# Patient Record
Sex: Male | Born: 1970 | Race: White | Hispanic: No | Marital: Married | Smoking: Current every day smoker
Health system: Southern US, Community
[De-identification: ages and names within clinical notes are randomized; demographics above are authoritative.]

## PROBLEM LIST (undated history)

## (undated) DIAGNOSIS — T4145XA Adverse effect of unspecified anesthetic, initial encounter: Secondary | ICD-10-CM

## (undated) DIAGNOSIS — J449 Chronic obstructive pulmonary disease, unspecified: Secondary | ICD-10-CM

## (undated) DIAGNOSIS — J42 Unspecified chronic bronchitis: Secondary | ICD-10-CM

## (undated) DIAGNOSIS — S7400XA Injury of sciatic nerve at hip and thigh level, unspecified leg, initial encounter: Secondary | ICD-10-CM

## (undated) DIAGNOSIS — F329 Major depressive disorder, single episode, unspecified: Secondary | ICD-10-CM

## (undated) DIAGNOSIS — I10 Essential (primary) hypertension: Secondary | ICD-10-CM

## (undated) DIAGNOSIS — N179 Acute kidney failure, unspecified: Secondary | ICD-10-CM

## (undated) DIAGNOSIS — M545 Low back pain: Secondary | ICD-10-CM

## (undated) DIAGNOSIS — G8929 Other chronic pain: Secondary | ICD-10-CM

## (undated) DIAGNOSIS — F32A Depression, unspecified: Secondary | ICD-10-CM

## (undated) DIAGNOSIS — R51 Headache: Secondary | ICD-10-CM

## (undated) DIAGNOSIS — F1910 Other psychoactive substance abuse, uncomplicated: Secondary | ICD-10-CM

## (undated) DIAGNOSIS — M5137 Other intervertebral disc degeneration, lumbosacral region: Secondary | ICD-10-CM

---
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
## 2006-11-15 ENCOUNTER — Emergency Department (HOSPITAL_COMMUNITY): Admission: EM | Admit: 2006-11-15 | Discharge: 2006-11-15 | Payer: Self-pay | Admitting: Emergency Medicine

## 2006-11-16 ENCOUNTER — Emergency Department (HOSPITAL_COMMUNITY): Admission: EM | Admit: 2006-11-16 | Discharge: 2006-11-17 | Payer: Self-pay | Admitting: Emergency Medicine

## 2008-04-03 ENCOUNTER — Emergency Department (HOSPITAL_BASED_OUTPATIENT_CLINIC_OR_DEPARTMENT_OTHER): Admission: EM | Admit: 2008-04-03 | Discharge: 2008-04-04 | Payer: Self-pay | Admitting: Emergency Medicine

## 2008-04-23 ENCOUNTER — Emergency Department (HOSPITAL_BASED_OUTPATIENT_CLINIC_OR_DEPARTMENT_OTHER): Admission: EM | Admit: 2008-04-23 | Discharge: 2008-04-23 | Payer: Self-pay | Admitting: Emergency Medicine

## 2008-04-27 ENCOUNTER — Ambulatory Visit: Payer: Self-pay | Admitting: Cardiology

## 2008-05-02 ENCOUNTER — Emergency Department (HOSPITAL_BASED_OUTPATIENT_CLINIC_OR_DEPARTMENT_OTHER): Admission: EM | Admit: 2008-05-02 | Discharge: 2008-05-02 | Payer: Self-pay | Admitting: Emergency Medicine

## 2008-05-11 ENCOUNTER — Ambulatory Visit: Payer: Self-pay

## 2008-05-11 ENCOUNTER — Ambulatory Visit: Payer: Self-pay | Admitting: Cardiology

## 2008-08-04 ENCOUNTER — Emergency Department (HOSPITAL_BASED_OUTPATIENT_CLINIC_OR_DEPARTMENT_OTHER): Admission: EM | Admit: 2008-08-04 | Discharge: 2008-08-05 | Payer: Self-pay | Admitting: Emergency Medicine

## 2009-01-02 ENCOUNTER — Ambulatory Visit: Payer: Self-pay | Admitting: Radiology

## 2009-01-02 ENCOUNTER — Emergency Department (HOSPITAL_BASED_OUTPATIENT_CLINIC_OR_DEPARTMENT_OTHER): Admission: EM | Admit: 2009-01-02 | Discharge: 2009-01-02 | Payer: Self-pay | Admitting: Emergency Medicine

## 2009-05-08 ENCOUNTER — Emergency Department (HOSPITAL_BASED_OUTPATIENT_CLINIC_OR_DEPARTMENT_OTHER): Admission: EM | Admit: 2009-05-08 | Discharge: 2009-05-08 | Payer: Self-pay | Admitting: Emergency Medicine

## 2009-08-12 ENCOUNTER — Telehealth (INDEPENDENT_AMBULATORY_CARE_PROVIDER_SITE_OTHER): Payer: Self-pay | Admitting: *Deleted

## 2010-07-09 NOTE — Progress Notes (Signed)
Summary: REFILL   Phone Note Refill Request Call back at [phone] Message from:  Patient on August 12, 2009 3:05 PM  Refills Requested: Medication #1:  ATENOLOL 25 MG TABS 1 by mouth daily. [facility] [phone]  Initial call taken by: Judie Grieve,  August 12, 2009 3:06 PM Caller: Patient  Follow-up for Phone Call        Rx faxed to pharmacy Follow-up by: Oswald Hillock,  August 12, 2009 4:34 PM    Prescriptions: ATENOLOL 25 MG TABS (ATENOLOL) 1 by mouth daily  #30 x 5   Entered by:   Oswald Hillock   Authorized by:   Rollene Rotunda, MD, [facility]   Signed by:   Oswald Hillock on 08/12/2009   Method used:   Electronically to        [facility] (retail)       [address]       [place], [place]  [id number]       Ph: [phone]       Fax: [phone]   RxID:   [id number]

## 2010-07-09 NOTE — Miscellaneous (Signed)
Summary: atenolol rx  Clinical Lists Changes  Medications: Changed medication from ATENOLOL 25 MG TABS (ATENOLOL) 1 by mouth daily to ATENOLOL 25 MG TABS (ATENOLOL) one by mouth bid - Signed Rx of ATENOLOL 25 MG TABS (ATENOLOL) one by mouth bid;  #60 x 11;  Signed;  Entered by: Charolotte Capuchin, RN;  Authorized by: Rollene Rotunda, MD, [facility];  Method used: Electronically to [facility]. [phone]*, [address] [place], [place]  [address], Ph: [phone], Fax: [phone]    Prescriptions: ATENOLOL 25 MG TABS (ATENOLOL) one by mouth bid  #60 x 11   Entered by:   Charolotte Capuchin, RN   Authorized by:   Rollene Rotunda, MD, [facility]   Signed by:   Charolotte Capuchin, RN on 08/16/2009   Method used:   Electronically to        [facility]. [phone]* (retail)       [address]       [place], [place]  [address]       Ph: [phone]       Fax: [phone]   RxID:   [id number]

## 2010-09-23 LAB — DIFFERENTIAL
Basophils Absolute: 0.1 K/uL (ref 0.0–0.1)
Basophils Relative: 1 % (ref 0–1)
Eosinophils Absolute: 0.4 K/uL (ref 0.0–0.7)
Eosinophils Relative: 3 % (ref 0–5)
Lymphocytes Relative: 29 % (ref 12–46)
Lymphs Abs: 3.1 K/uL (ref 0.7–4.0)
Monocytes Absolute: 0.6 10*3/uL (ref 0.1–1.0)
Monocytes Relative: 6 % (ref 3–12)
Neutro Abs: 6.8 K/uL (ref 1.7–7.7)
Neutrophils Relative %: 62 % (ref 43–77)

## 2010-09-23 LAB — CBC
HCT: 41.8 % (ref 39.0–52.0)
Hemoglobin: 14.4 g/dL (ref 13.0–17.0)
MCHC: 34.5 g/dL (ref 30.0–36.0)
MCV: 96.9 fL (ref 78.0–100.0)
Platelets: 254 10*3/uL (ref 150–400)
RBC: 4.31 MIL/uL (ref 4.22–5.81)
RDW: 12.6 % (ref 11.5–15.5)
WBC: 11 10*3/uL — ABNORMAL HIGH (ref 4.0–10.5)

## 2010-09-23 LAB — POCT CARDIAC MARKERS
CKMB, poc: 2.3 ng/mL (ref 1.0–8.0)
Myoglobin, poc: 61 ng/mL (ref 12–200)
Troponin i, poc: <0.05 ng/mL (ref 0.00–0.09)

## 2010-09-23 LAB — BASIC METABOLIC PANEL WITH GFR
BUN: 18 mg/dL (ref 6–23)
CO2: 26 meq/L (ref 19–32)
Chloride: 108 meq/L (ref 96–112)
Creatinine, Ser: 1 mg/dL (ref 0.4–1.5)
GFR calc Af Amer: >60 mL/min (ref >60)
GFR calc non Af Amer: >60 mL/min (ref >60)

## 2010-09-23 LAB — BASIC METABOLIC PANEL
Calcium: 9 mg/dL (ref 8.4–10.5)
Glucose, Bld: 86 mg/dL (ref 70–99)
Potassium: 4.5 mEq/L (ref 3.5–5.1)
Sodium: 141 mEq/L (ref 135–145)

## 2010-10-21 NOTE — Assessment & Plan Note (Signed)
Jonathan Mcbride                            CARDIOLOGY OFFICE NOTE   NAME:Jonathan Mcbride, Jonathan Mcbride                        MRN:          [id number]  DATE:05/11/2008                            DOB:          1971-05-07    PRIMARY CARE PHYSICIAN:  Dr. Shelle Mcbride.   REASON FOR PRESENTATION:  Evaluate patient with palpitations and  hypertension.   HISTORY OF PRESENT ILLNESS:  The patient returns for followup of the  above.  Since I last saw him, he has had fewer palpitations.  He has  been taking atenolol 25 mg and he stopped drinking caffeine for the most  part.  He is still having occasional palpitation, but no presyncope or  syncope.  He is having no new chest discomfort, neck or arm discomfort.  He is still having a lot of problem with sleeping.  He is due to put on  his event monitor, but we did not have one when he was here last time so  he has not yet done that.  I have not got any echo from Jonathan Mcbride which  she was suppose to pickup.   PAST MEDICAL HISTORY:  Bronchitis.   ALLERGIES/INTOLERANCE:  CODEINE.   MEDICATIONS:  Atenolol 25 mg daily.   REVIEW OF SYSTEMS:  As stated in the HPI and otherwise negative for  other systems.   PHYSICAL EXAMINATION:  GENERAL:  The patient is pleasant in no distress.  VITAL SIGNS:  Blood pressure 111/80, heart rate 103 and regular, weight  272 pounds, and body mass index 35.  HEENT:  Eyes unremarkable.  Pupils are equal, round, and reactive to  light.  Fundi not visualized.  Oral mucosa is unremarkable.  NECK:  No jugular venous distension at 45 degrees, carotid upstroke  brisk and symmetrical.  No bruit.  No thyromegaly.  LYMPHATICS:  No cervical, axillary, or inguinal adenopathy.  LUNGS:  Clear to auscultation bilaterally.  BACK:  No costovertebral angle tenderness.  CHEST:  Unremarkable.  HEART:  PMI not displaced or sustained.  S1 and S2 are within normal  limits.  No S3.  No S4.  No clicks, no rubs, no murmurs.  ABDOMEN:  Obese, positive bowel sounds, normal in frequency and pitch.  No bruits, no rebound, no guarding.  No midline pulsatile mass.  No  organomegaly.  SKIN:  No rashes, no nodules.  EXTREMITIES:  A 2+ pulses throughout.  No edema, cyanosis, or clubbing.  NEURO:  Oriented to person, place, and time.  Cranial nerves II through  XII grossly intact.  Motor gross intact.   ASSESSMENT AND PLAN:  1. Palpitations.  These are improved, but I am still going to have him      over the event monitor.  He is going to avoid caffeine.  Further      evaluation will continually based on these results.  2. Tobacco.  He is going to stop smoking.  He is on Chantix and      tolerating this.  3. Hypertension.  Blood pressure is usually well controlled on the      medications as  listed.  We will also concentrate on weight loss.  4. Insomnia.  He is really bothered by this, but he cannot get hold of      this primary doctor.  I am going to go ahead and give him a 57-month      prescription for Ambien.  He will need to get refill for this, if      it works from his primary doctor.  5. Followup.  I will see the patient back in about 6 months, but we      will call him if anything abnormal on the stress perfusion or on      the event monitor.     Jonathan Rotunda, MD, [facility]  Electronically Signed    JH/MedQ  DD: 05/11/2008  DT: 05/12/2008  Job #: [id number]   cc:   Jonathan Mcbride

## 2010-10-21 NOTE — Assessment & Plan Note (Signed)
[facility]                            CARDIOLOGY OFFICE NOTE   NAME:Jonathan Mcbride, Leighty                        MRN:          [id number]  DATE:04/27/2008                            DOB:          May 19, 1971    PRIMARY CARE PHYSICIAN:  Shelle Iron, MD   REASON FOR PRESENTATION:  Evaluate the patient with headaches,  hypertension, and palpitations.   HISTORY OF PRESENT ILLNESS:  The patient is a pleasant 40 year old  gentleman with apparent history of chest discomfort.  He reports he had  a stress echo at [facility] about a year ago.  He says the  results were inconclusive, as they could not obtain the postexercise  images.  He has not had any further cardiac workup.  He has not had any  significant chest discomfort since that time.  He has now been bothered  by headaches.  These have been going on for perhaps 3 months.  They are  frontal.  When they come on, they last all day.  They are moderate in  intensity.  He has been taking quite a few ibuprofen to try to treat  these.  He says he may have noticed some correlation with his blood  pressure.  He went to the [facility] ER not long ago and had an  elevated blood pressure with the headache.  He was treated with pain  management, but no antihypertensive.  He has not had any blurred vision  loss or speech or motor.  He has not had any evaluation of this and did  not tell his new primary care doctor about this.   He has also been describing palpitations.  These happen perhaps every  couple of days now.  He describes his heart skipping.  He does not  describe sustained tachyarrhythmias, but rather isolated ectopic beats  possibly.  He does drink quite a bit of caffeine with coffee in the  morning and tea a couple times throughout the day.  He has never had a  monitor for this.   PAST MEDICAL HISTORY:  Bronchitis.   PAST SURGICAL HISTORY:  None.   ALLERGIES:  CODEINE.   MEDICATIONS:   Ibuprofen.   SOCIAL HISTORY:  The patient is married.  He has 29 year old son about  to enter the [employer].  He has been smoking for 16 years about three-  quarters of a pack a day currently.   FAMILY HISTORY:  Noncontributory for early coronary artery disease,  sudden cardiac death, syncope, heart failure.   REVIEW OF SYSTEMS:  As stated in the HPI and positive for occasional  wheezing, low back pain, tingling in his hands and fingers.  Negative  for all other systems.  Insomnia.   PHYSICAL EXAMINATION:  GENERAL:  The patient is pleasant and in no  distress.  VITAL SIGNS:  Blood pressure 134/100, heart rate 81 and regular, weight  270 pounds, body mass index 35.  HEENT:  Eyelids are unremarkable.  Pupils equal, round, and reactive to  light.  Fundi not well visualized.  Oral mucosa unremarkable.  NECK:  No  jugular venous distention at 45 degrees.  Carotid upstroke  brisk and symmetric, no bruit, no thyromegaly.  LYMPHATICS:  No cervical, axillary, or inguinal adenopathy.  LUNGS:  Clear to auscultation bilaterally.  BACK:  No costovertebral angle tenderness.  CHEST:  Unremarkable.  HEART:  PMI not displaced or sustained, S1 and S2 within normal limits.  No S3, no S4, no clicks, no rubs, no murmurs.  ABDOMEN:  Obese, positive bowel sounds, normal in frequency and pitch,  no bruits, no rebound, no guarding, no midline pulsatile mass, no  hepatomegaly, no splenomegaly.  SKIN:  No rashes, no nodules.  EXTREMITIES:  Pulses 2+ throughout, no edema, no cyanosis, no clubbing.  NEURO:  Oriented to person, place, and time.  Cranial nerves II through  XII grossly intact, motor grossly intact throughout.   EKG sinus rhythm, right bundle branch block, rate 79, no acute ST-T wave  changes.   ASSESSMENT AND PLAN:  1. Palpitations.  The patient is describing probable premature atrial      or ventricular ectopic beats.  At this point, I am going to have      him wear an event monitor to see  if we can capture these.  He has      had lab work.  I will try to see if he has had a TSH any time      recently.  If not, we can order this.  Further evaluation will be      based on future symptoms.  Of note, I have asked him to cut out the      caffeine which may be significantly contributing to the      palpitations.  2. Hypertension.  His blood pressure is elevated.  I am going to have      him start low dose of atenolol 25 mg at night.  This may help with      the palpitations.  He is also having insomnia.  This may improve      this.  He is also instructed to lose weight, which will contribute      to blood pressure control.  3. Right bundle branch block.  I am going to try to get his      echocardiogram results from [place] from the last year.  If they      have good enough images to exclude any structural heart disease,      then no further evaluation will be planned.  However, if not, I      will have to repeat an echocardiogram.  4. Followup.  I would like to see the patient back after the above      results.     Rollene Rotunda, MD, [facility]  Electronically Signed    JH/MedQ  DD: 04/27/2008  DT: 04/28/2008  Job #: [id number]

## 2011-03-10 LAB — DIFFERENTIAL
Basophils Absolute: 0.1
Lymphocytes Relative: 25
Monocytes Relative: 3

## 2011-03-10 LAB — CBC
RBC: 4.73
WBC: 9.8

## 2011-03-10 LAB — POCT TOXICOLOGY PANEL

## 2011-03-10 LAB — BASIC METABOLIC PANEL
BUN: 14
Calcium: 9.4
Creatinine, Ser: 1
Sodium: 141

## 2012-02-01 HISTORY — PX: HIP ARTHROSCOPY: SHX668

## 2012-03-08 DIAGNOSIS — T8859XA Other complications of anesthesia, initial encounter: Secondary | ICD-10-CM

## 2012-03-08 HISTORY — PX: PERCUTANEOUS PINNING PHALANX FRACTURE OF HAND: SUR1027

## 2012-03-08 HISTORY — PX: FIXATION KYPHOPLASTY LUMBAR SPINE: SHX1642

## 2012-03-08 HISTORY — DX: Other complications of anesthesia, initial encounter: T88.59XA

## 2013-11-22 ENCOUNTER — Emergency Department (HOSPITAL_BASED_OUTPATIENT_CLINIC_OR_DEPARTMENT_OTHER): Payer: Self-pay

## 2013-11-22 ENCOUNTER — Encounter (HOSPITAL_BASED_OUTPATIENT_CLINIC_OR_DEPARTMENT_OTHER): Payer: Self-pay | Admitting: Emergency Medicine

## 2013-11-22 ENCOUNTER — Observation Stay (HOSPITAL_BASED_OUTPATIENT_CLINIC_OR_DEPARTMENT_OTHER)
Admission: EM | Admit: 2013-11-22 | Discharge: 2013-11-23 | Disposition: A | Discharge status: Home / Self Care | Payer: Self-pay | Attending: Internal Medicine | Admitting: Internal Medicine

## 2013-11-22 DIAGNOSIS — F17200 Nicotine dependence, unspecified, uncomplicated: Secondary | ICD-10-CM | POA: Insufficient documentation

## 2013-11-22 DIAGNOSIS — R5381 Other malaise: Secondary | ICD-10-CM | POA: Insufficient documentation

## 2013-11-22 DIAGNOSIS — J4489 Other specified chronic obstructive pulmonary disease: Secondary | ICD-10-CM | POA: Insufficient documentation

## 2013-11-22 DIAGNOSIS — T675XXA Heat exhaustion, unspecified, initial encounter: Secondary | ICD-10-CM

## 2013-11-22 DIAGNOSIS — I10 Essential (primary) hypertension: Secondary | ICD-10-CM | POA: Insufficient documentation

## 2013-11-22 DIAGNOSIS — Z23 Encounter for immunization: Secondary | ICD-10-CM | POA: Insufficient documentation

## 2013-11-22 DIAGNOSIS — J449 Chronic obstructive pulmonary disease, unspecified: Secondary | ICD-10-CM | POA: Insufficient documentation

## 2013-11-22 DIAGNOSIS — R55 Syncope and collapse: Secondary | ICD-10-CM

## 2013-11-22 DIAGNOSIS — E860 Dehydration: Secondary | ICD-10-CM

## 2013-11-22 DIAGNOSIS — T675XXD Heat exhaustion, unspecified, subsequent encounter: Secondary | ICD-10-CM

## 2013-11-22 DIAGNOSIS — R079 Chest pain, unspecified: Principal | ICD-10-CM | POA: Diagnosis present

## 2013-11-22 DIAGNOSIS — R5383 Other fatigue: Secondary | ICD-10-CM

## 2013-11-22 DIAGNOSIS — N179 Acute kidney failure, unspecified: Secondary | ICD-10-CM | POA: Diagnosis present

## 2013-11-22 DIAGNOSIS — T671XXS Heat syncope, sequela: Secondary | ICD-10-CM

## 2013-11-22 DIAGNOSIS — R0602 Shortness of breath: Secondary | ICD-10-CM | POA: Insufficient documentation

## 2013-11-22 DIAGNOSIS — D72829 Elevated white blood cell count, unspecified: Secondary | ICD-10-CM | POA: Diagnosis present

## 2013-11-22 HISTORY — DX: Acute kidney failure, unspecified: N17.9

## 2013-11-22 HISTORY — DX: Other chronic pain: G89.29

## 2013-11-22 HISTORY — DX: Chronic obstructive pulmonary disease, unspecified: J44.9

## 2013-11-22 HISTORY — DX: Other intervertebral disc degeneration, lumbosacral region: M51.37

## 2013-11-22 HISTORY — DX: Adverse effect of unspecified anesthetic, initial encounter: T41.45XA

## 2013-11-22 HISTORY — DX: Unspecified chronic bronchitis: J42

## 2013-11-22 HISTORY — DX: Low back pain: M54.5

## 2013-11-22 HISTORY — DX: Injury of sciatic nerve at hip and thigh level, unspecified leg, initial encounter: S74.00XA

## 2013-11-22 HISTORY — DX: Essential (primary) hypertension: I10

## 2013-11-22 HISTORY — DX: Headache: R51

## 2013-11-22 LAB — RAPID URINE DRUG SCREEN, HOSP PERFORMED
AMPHETAMINES: NOT DETECTED
Barbiturates: NOT DETECTED
Benzodiazepines: NOT DETECTED
Cocaine: POSITIVE — AB
Opiates: NOT DETECTED
Tetrahydrocannabinol: POSITIVE — AB

## 2013-11-22 LAB — COMPREHENSIVE METABOLIC PANEL
ALK PHOS: 96 U/L (ref 39–117)
ALT: 36 U/L (ref 0–53)
AST: 44 U/L — ABNORMAL HIGH (ref 0–37)
Albumin: 5.4 g/dL — ABNORMAL HIGH (ref 3.5–5.2)
BILIRUBIN TOTAL: 0.7 mg/dL (ref 0.3–1.2)
BUN: 29 mg/dL — ABNORMAL HIGH (ref 6–23)
CALCIUM: 11.4 mg/dL — AB (ref 8.4–10.5)
CO2: 22 mEq/L (ref 19–32)
Chloride: 96 mEq/L (ref 96–112)
Creatinine, Ser: 2.8 mg/dL — ABNORMAL HIGH (ref 0.50–1.35)
GFR calc Af Amer: 30 mL/min — ABNORMAL LOW (ref >90)
GFR, EST NON AFRICAN AMERICAN: 26 mL/min — AB (ref >90)
GLUCOSE: 127 mg/dL — AB (ref 70–99)
Potassium: 4.5 mEq/L (ref 3.7–5.3)
Sodium: 139 mEq/L (ref 137–147)
Total Protein: 8.9 g/dL — ABNORMAL HIGH (ref 6.0–8.3)

## 2013-11-22 LAB — URINALYSIS, ROUTINE W REFLEX MICROSCOPIC
Glucose, UA: NEGATIVE mg/dL
HGB URINE DIPSTICK: NEGATIVE
KETONES UR: 15 mg/dL — AB
Leukocytes, UA: NEGATIVE
Nitrite: NEGATIVE
PROTEIN: 30 mg/dL — AB
Specific Gravity, Urine: 1.022 (ref 1.005–1.030)
Urobilinogen, UA: 0.2 mg/dL (ref 0.0–1.0)
pH: 5 (ref 5.0–8.0)

## 2013-11-22 LAB — CBC WITH DIFFERENTIAL/PLATELET
BASOS PCT: 0 % (ref 0–1)
Basophils Absolute: 0 10*3/uL (ref 0.0–0.1)
EOS ABS: 0.1 10*3/uL (ref 0.0–0.7)
Eosinophils Relative: 0 % (ref 0–5)
HCT: 47.6 % (ref 39.0–52.0)
Hemoglobin: 16.9 g/dL (ref 13.0–17.0)
LYMPHS ABS: 1.4 10*3/uL (ref 0.7–4.0)
Lymphocytes Relative: 9 % — ABNORMAL LOW (ref 12–46)
MCH: 35.3 pg — ABNORMAL HIGH (ref 26.0–34.0)
MCHC: 35.5 g/dL (ref 30.0–36.0)
MCV: 99.4 fL (ref 78.0–100.0)
MONO ABS: 1.1 10*3/uL — AB (ref 0.1–1.0)
Monocytes Relative: 7 % (ref 3–12)
Neutro Abs: 12.7 10*3/uL — ABNORMAL HIGH (ref 1.7–7.7)
Neutrophils Relative %: 83 % — ABNORMAL HIGH (ref 43–77)
PLATELETS: 270 10*3/uL (ref 150–400)
RBC: 4.79 MIL/uL (ref 4.22–5.81)
RDW: 14 % (ref 11.5–15.5)
WBC: 15.3 10*3/uL — ABNORMAL HIGH (ref 4.0–10.5)

## 2013-11-22 LAB — CBC
HCT: 41.7 % (ref 39.0–52.0)
Hemoglobin: 14.5 g/dL (ref 13.0–17.0)
MCH: 34.6 pg — ABNORMAL HIGH (ref 26.0–34.0)
MCHC: 34.8 g/dL (ref 30.0–36.0)
MCV: 99.5 fL (ref 78.0–100.0)
PLATELETS: 232 10*3/uL (ref 150–400)
RBC: 4.19 MIL/uL — ABNORMAL LOW (ref 4.22–5.81)
RDW: 14.5 % (ref 11.5–15.5)
WBC: 12.1 10*3/uL — ABNORMAL HIGH (ref 4.0–10.5)

## 2013-11-22 LAB — TROPONIN I

## 2013-11-22 LAB — URINE MICROSCOPIC-ADD ON

## 2013-11-22 LAB — CK: CK TOTAL: 604 U/L — AB (ref 7–232)

## 2013-11-22 MED ORDER — GABAPENTIN 400 MG PO CAPS
800.0000 mg | ORAL_CAPSULE | Freq: Three times a day (TID) | ORAL | Status: DC
  Administered 2013-11-23 (×3): 800 mg via ORAL
  Filled 2013-11-22 (×4): qty 2

## 2013-11-22 MED ORDER — ONDANSETRON HCL 4 MG/2ML IJ SOLN: 4.0000 mg | Freq: Four times a day (QID) | INTRAMUSCULAR | Status: DC | PRN

## 2013-11-22 MED ORDER — TRAMADOL HCL 50 MG PO TABS
50.0000 mg | ORAL_TABLET | Freq: Four times a day (QID) | ORAL | Status: DC | PRN
  Administered 2013-11-23 (×2): 50 mg via ORAL
  Filled 2013-11-22 (×2): qty 1

## 2013-11-22 MED ORDER — SODIUM CHLORIDE 0.9 % IV BOLUS (SEPSIS)
1000.0000 mL | Freq: Once | INTRAVENOUS | Status: AC
  Administered 2013-11-22: 1000 mL via INTRAVENOUS

## 2013-11-22 MED ORDER — ACETAMINOPHEN 325 MG PO TABS: 650.0000 mg | ORAL_TABLET | Freq: Four times a day (QID) | ORAL | Status: DC | PRN

## 2013-11-22 MED ORDER — SODIUM CHLORIDE 0.9 % IV SOLN
INTRAVENOUS | Status: DC
  Administered 2013-11-22 – 2013-11-23 (×3): via INTRAVENOUS

## 2013-11-22 MED ORDER — ALBUTEROL SULFATE (2.5 MG/3ML) 0.083% IN NEBU
INHALATION_SOLUTION | RESPIRATORY_TRACT | Status: AC
  Administered 2013-11-22: 2.5 mg
  Filled 2013-11-22: qty 3

## 2013-11-22 MED ORDER — SODIUM CHLORIDE 0.9 % IJ SOLN
3.0000 mL | Freq: Two times a day (BID) | INTRAMUSCULAR | Status: DC
  Administered 2013-11-23: 3 mL via INTRAVENOUS

## 2013-11-22 MED ORDER — PNEUMOCOCCAL VAC POLYVALENT 25 MCG/0.5ML IJ INJ
0.5000 mL | INJECTION | INTRAMUSCULAR | Status: AC
  Administered 2013-11-23: 0.5 mL via INTRAMUSCULAR
  Filled 2013-11-22: qty 0.5

## 2013-11-22 MED ORDER — NITROGLYCERIN 0.4 MG SL SUBL: 0.4000 mg | SUBLINGUAL_TABLET | SUBLINGUAL | Status: DC | PRN

## 2013-11-22 MED ORDER — CYCLOBENZAPRINE HCL 5 MG PO TABS
5.0000 mg | ORAL_TABLET | Freq: Two times a day (BID) | ORAL | Status: DC
  Administered 2013-11-23 (×2): 5 mg via ORAL
  Filled 2013-11-22 (×3): qty 1

## 2013-11-22 MED ORDER — ACETAMINOPHEN 650 MG RE SUPP: 650.0000 mg | Freq: Four times a day (QID) | RECTAL | Status: DC | PRN

## 2013-11-22 MED ORDER — ENOXAPARIN SODIUM 40 MG/0.4ML ~~LOC~~ SOLN
40.0000 mg | Freq: Every day | SUBCUTANEOUS | Status: DC
  Administered 2013-11-23: 40 mg via SUBCUTANEOUS
  Filled 2013-11-22 (×2): qty 0.4

## 2013-11-22 MED ORDER — ONDANSETRON HCL 4 MG PO TABS: 4.0000 mg | ORAL_TABLET | Freq: Four times a day (QID) | ORAL | Status: DC | PRN

## 2013-11-22 MED ORDER — GABAPENTIN 800 MG PO TABS
800.0000 mg | ORAL_TABLET | Freq: Three times a day (TID) | ORAL | Status: DC
  Filled 2013-11-22: qty 1

## 2013-11-22 MED ORDER — SODIUM CHLORIDE 0.9 % IV SOLN: INTRAVENOUS | Status: DC

## 2013-11-22 NOTE — ED Notes (Signed)
Pt given food and drink per Dr. Judd Lienelo.

## 2013-11-22 NOTE — ED Notes (Signed)
Pt c/o left chest pain onset while working outside. Pain feels like cramping and nonradiating. Also c/o 5-10 min loc and n/v.

## 2013-11-22 NOTE — H&P (Addendum)
[facility] History and Physical  Jonathan Mcbride ZOX:[id number] DOB: 04/16/1971 DOA: 11/22/2013  Referring physician: ER physician. [facility] PCP: No PCP Per Patient   Chief Complaint: Loss of consciousness and chest pain.  HPI: Jonathan Mcbride is a 43 y.o. male with history of sciatica and arrhythmias presented [facility] with complaints of loss of consciousness and chest pain. Patient states that he was working and his work required to carry [employer]weights and he started developing some chest pressure and later became diaphoretic and his lower extremity started cramping and lost consciousness. He has been having some headache since yesterday which is mostly in the occipital area. His colleagues sprinkled water on him and he regained his consciousness. Since then has been feeling fatigued and weak but by then his chest pressure had resolved. He went to his home and later came to the ER. In the ER labs revealed elevated creatinine with mildly elevated CK. Patient also had leukocytosis but was afebrile. Patient has been admitted for further management. Urine drug screen is positive for marijuana and cocaine. Patient has not been taking his Neurontin and Flexeril for last 3 days as he out of his medications. On exam patient is nonfocal and presently has no chest pain denies any shortness of breath still has occipital headache.  Review of Systems: As presented in the history of presenting illness, rest negative.  Past Medical History  Diagnosis Date  . COPD (chronic obstructive pulmonary disease)   . Hypertension   . DDD (degenerative disc disease)    Past Surgical History  Procedure Laterality Date  . Hand surgery    . Hip arthroscope     Social History:  reports that he has been smoking Cigarettes.  He has been smoking about 0.00 packs per day. He does not have any smokeless tobacco history on file. He reports that he drinks alcohol. He  reports that he uses illicit drugs (Marijuana). Where does patient live home. Can patient participate in ADLs? Yes.  No Known Allergies  Family History:  Family History  Problem Relation Age of Onset  . Diabetes Mellitus II Sister   . CAD Other   . Uterine cancer Maternal Aunt       Prior to Admission medications   Medication Sig Start Date End Date Taking? Authorizing Jonathan Mcbride  ATENOLOL PO Take by mouth.   Yes Historical Jared Whorley, MD  Cyclobenzaprine HCl (FLEXERIL PO) Take by mouth.   Yes Historical Abygayle Deltoro, MD  GABAPENTIN PO Take by mouth.   Yes Historical Starlette Thurow, MD    Physical Exam: Filed Vitals:   11/22/13 1816 11/22/13 2016 11/22/13 2200  BP: 151/79  117/75  Pulse: 93 76 92  Temp: 99 F (37.2 C)  98.9 F (37.2 C)  TempSrc: Oral  Oral  Resp: 16 18 18   Height: 6\' 2"  (1.88 m)    Weight: 111.131 kg (245 lb)    SpO2: 99% 100% 96%     General:  Well-developed and nourished.  Eyes: Anicteric no pallor.  ENT: No discharge from the ears eyes nose mouth.  Neck: No mass felt.  Cardiovascular: S1-S2 heard.  Respiratory: No rhonchi or crepitations.  Abdomen: Soft nontender bowel sounds present. No guarding rigidity.  Skin: No rash.  Musculoskeletal: No edema.  Psychiatric: Appears normal.  Neurologic: Alert awake oriented to time place and person. Moves all extremities.  Labs on Admission:  Basic Metabolic Panel:  Recent Labs Lab 11/22/13 1815  NA 139  K 4.5  CL 96  CO2 22  GLUCOSE 127*  BUN 29*  CREATININE 2.80*  CALCIUM 11.4*   Liver Function Tests:  Recent Labs Lab 11/22/13 1815  AST 44*  ALT 36  ALKPHOS 96  BILITOT 0.7  PROT 8.9*  ALBUMIN 5.4*   No results found for this basename: LIPASE, AMYLASE,  in the last 168 hours No results found for this basename: AMMONIA,  in the last 168 hours CBC:  Recent Labs Lab 11/22/13 1815  WBC 15.3*  NEUTROABS 12.7*  HGB 16.9  HCT 47.6  MCV 99.4  PLT 270   Cardiac Enzymes:  Recent  Labs Lab 11/22/13 1815 11/22/13 1843  CKTOTAL  --  604*  TROPONINI <0.30  --     BNP (last 3 results) No results found for this basename: PROBNP,  in the last 8760 hours CBG: No results found for this basename: GLUCAP,  in the last 168 hours  Radiological Exams on Admission: Dg Chest 2 View  11/22/2013   CLINICAL DATA:  Loss of consciousness  EXAM: CHEST  2 VIEW  COMPARISON:  None.  FINDINGS: Cardiomediastinal silhouette is stable. Mild lower thoracic levoscoliosis. Mild degenerative changes thoracic spine. No acute infiltrate or pulmonary edema. Subtle mild compression deformity mid thoracic spine of indeterminate age.  IMPRESSION: Mild lower thoracic levoscoliosis. Mild degenerative changes thoracic spine. No acute infiltrate or pulmonary edema. Subtle mild compression deformity mid thoracic spine of indeterminate age. Clinical correlation is necessary.   Electronically Signed   By: Natasha MeadLiviu  Pop M.D.   On: 11/22/2013 19:09    EKG: Independently reviewed. Normal sinus rhythm with RBBB and left axis deviation.  Assessment/Plan Principal Problem:   Syncope Active Problems:   Acute renal failure   Chest pain   Hypercalcemia   Leucocytosis   1. Syncope - probably seen to dehydration and heat exhaustion. Continue with aggressive IV hydration. Closely follow intake output and metabolic panel and CK levels. Patient also has positive for cocaine and we will check cardiac markers and rule out any arrhythmias as patient has had previous history of arrhythmias seen by [place] cardiologist Dr. Antoine PocheHochrein in 2009 and patient is beta blocker. 2. Chest pain - given that patient is positive for cocaine chest pain could be probably related to cocaine. Cycle cardiac markers. Aspirin. Check 2-D echo. 3. Acute renal failure and hypercalcemia - probably seen to dehydration. Aggressively hydrate and follow metabolic panel. Check parathormone vitamin D levels. Check FeNa. 4. History of arrhythmia - since  patient's urine is positive for cocaine I'm holding off beta blockers for now. 5. Leukocytosis - probably reactionary. Follow CBC. 6. History of low back pain and sciatica - I have placed patient back on his Neurontin and Flexeril. If patient's renal function does not improve with hydration then decrease Neurontin dose. 7. Headache - CT head is pending. When necessary tramadol. 8. Polysubstance abuse - tobacco cessation counseling requested and social work consult.    Code Status: Full code.  Family Communication: Wife at the bedside.  Disposition Plan: Admit for observation.    KAKRAKANDY,ARSHAD N. [facility] Pager [phone].  If 7PM-7AM, please contact night-coverage [email or web address] Password [email or web address] 11/22/2013, 10:43 PM
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
## 2013-11-22 NOTE — ED Notes (Signed)
Pt advised that he needed to provide a urine sample. Pt sts that he cannot at this time but will as soon as he can.

## 2013-11-23 ENCOUNTER — Observation Stay (HOSPITAL_COMMUNITY): Payer: Self-pay

## 2013-11-23 DIAGNOSIS — I517 Cardiomegaly: Secondary | ICD-10-CM

## 2013-11-23 DIAGNOSIS — T675XXA Heat exhaustion, unspecified, initial encounter: Secondary | ICD-10-CM

## 2013-11-23 DIAGNOSIS — Z5189 Encounter for other specified aftercare: Secondary | ICD-10-CM

## 2013-11-23 DIAGNOSIS — E860 Dehydration: Secondary | ICD-10-CM

## 2013-11-23 DIAGNOSIS — T7589XS Other specified effects of external causes, sequela: Secondary | ICD-10-CM

## 2013-11-23 LAB — CBC WITH DIFFERENTIAL/PLATELET
Basophils Absolute: 0 10*3/uL (ref 0.0–0.1)
Basophils Relative: 0 % (ref 0–1)
EOS ABS: 0.3 10*3/uL (ref 0.0–0.7)
EOS PCT: 3 % (ref 0–5)
HCT: 38.8 % — ABNORMAL LOW (ref 39.0–52.0)
Hemoglobin: 13 g/dL (ref 13.0–17.0)
LYMPHS ABS: 2.7 10*3/uL (ref 0.7–4.0)
Lymphocytes Relative: 28 % (ref 12–46)
MCH: 33.8 pg (ref 26.0–34.0)
MCHC: 33.5 g/dL (ref 30.0–36.0)
MCV: 100.8 fL — AB (ref 78.0–100.0)
MONO ABS: 1 10*3/uL (ref 0.1–1.0)
Monocytes Relative: 10 % (ref 3–12)
Neutro Abs: 5.7 10*3/uL (ref 1.7–7.7)
Neutrophils Relative %: 59 % (ref 43–77)
Platelets: 209 10*3/uL (ref 150–400)
RBC: 3.85 MIL/uL — AB (ref 4.22–5.81)
RDW: 14.7 % (ref 11.5–15.5)
WBC: 9.6 10*3/uL (ref 4.0–10.5)

## 2013-11-23 LAB — COMPREHENSIVE METABOLIC PANEL
ALBUMIN: 3.6 g/dL (ref 3.5–5.2)
ALT: 25 U/L (ref 0–53)
AST: 34 U/L (ref 0–37)
Alkaline Phosphatase: 81 U/L (ref 39–117)
BUN: 27 mg/dL — ABNORMAL HIGH (ref 6–23)
CO2: 24 mEq/L (ref 19–32)
CREATININE: 1.5 mg/dL — AB (ref 0.50–1.35)
Calcium: 9.1 mg/dL (ref 8.4–10.5)
Chloride: 106 mEq/L (ref 96–112)
GFR calc Af Amer: 64 mL/min — ABNORMAL LOW (ref >90)
GFR calc non Af Amer: 55 mL/min — ABNORMAL LOW (ref >90)
Glucose, Bld: 100 mg/dL — ABNORMAL HIGH (ref 70–99)
Potassium: 4 mEq/L (ref 3.7–5.3)
Sodium: 144 mEq/L (ref 137–147)
Total Bilirubin: 0.2 mg/dL — ABNORMAL LOW (ref 0.3–1.2)
Total Protein: 6.6 g/dL (ref 6.0–8.3)

## 2013-11-23 LAB — CK: Total CK: 678 U/L — ABNORMAL HIGH (ref 7–232)

## 2013-11-23 LAB — TROPONIN I: Troponin I: <0.3 ng/mL (ref <0.30)

## 2013-11-23 LAB — CREATININE, SERUM
Creatinine, Ser: 1.62 mg/dL — ABNORMAL HIGH (ref 0.50–1.35)
GFR calc Af Amer: 58 mL/min — ABNORMAL LOW (ref >90)
GFR calc non Af Amer: 50 mL/min — ABNORMAL LOW (ref >90)

## 2013-11-23 LAB — SODIUM, URINE, RANDOM: SODIUM UR: 75 meq/L

## 2013-11-23 LAB — CREATININE, URINE, RANDOM: Creatinine, Urine: 246.62 mg/dL

## 2013-11-23 MED ORDER — CYCLOBENZAPRINE HCL 5 MG PO TABS: 5.0000 mg | ORAL_TABLET | Freq: Two times a day (BID) | ORAL | Status: DC

## 2013-11-23 MED ORDER — ALBUTEROL SULFATE (2.5 MG/3ML) 0.083% IN NEBU: 2.5000 mg | INHALATION_SOLUTION | RESPIRATORY_TRACT | Status: DC | PRN

## 2013-11-23 MED ORDER — GABAPENTIN 800 MG PO TABS: 800.0000 mg | ORAL_TABLET | Freq: Three times a day (TID) | ORAL | Status: DC

## 2013-11-23 MED ORDER — IBUPROFEN 800 MG PO TABS
800.0000 mg | ORAL_TABLET | Freq: Once | ORAL | Status: AC
  Administered 2013-11-23: 800 mg via ORAL
  Filled 2013-11-23: qty 1

## 2013-11-23 MED ORDER — ALBUTEROL SULFATE HFA 108 (90 BASE) MCG/ACT IN AERS: 1.0000 | INHALATION_SPRAY | Freq: Four times a day (QID) | RESPIRATORY_TRACT | Status: DC | PRN

## 2013-11-23 MED ORDER — TRAMADOL HCL 50 MG PO TABS: 50.0000 mg | ORAL_TABLET | Freq: Four times a day (QID) | ORAL | Status: DC | PRN

## 2013-11-23 MED ORDER — ATENOLOL 25 MG PO TABS: 12.5000 mg | ORAL_TABLET | Freq: Two times a day (BID) | ORAL | Status: DC

## 2013-11-23 MED ORDER — NITROGLYCERIN 0.4 MG SL SUBL: 0.4000 mg | SUBLINGUAL_TABLET | SUBLINGUAL | Status: DC | PRN

## 2013-11-23 MED ORDER — ASPIRIN 325 MG PO TABS
325.0000 mg | ORAL_TABLET | Freq: Every day | ORAL | Status: DC
  Administered 2013-11-23: 325 mg via ORAL
  Filled 2013-11-23: qty 1

## 2013-11-23 MED ORDER — ATENOLOL 12.5 MG HALF TABLET
12.5000 mg | ORAL_TABLET | Freq: Every day | ORAL | Status: DC
  Administered 2013-11-23: 12.5 mg via ORAL
  Filled 2013-11-23: qty 1

## 2013-11-23 MED ORDER — HYDROCODONE-ACETAMINOPHEN 5-325 MG PO TABS: 1.0000 | ORAL_TABLET | Freq: Four times a day (QID) | ORAL | Status: DC | PRN

## 2013-11-23 NOTE — Progress Notes (Signed)
  Echocardiogram 2D Echocardiogram has been performed.  GREGORY, ANGELA 11/23/2013, 11:06 AM

## 2013-11-23 NOTE — Discharge Summary (Addendum)
Physician Discharge Summary  Jonathan Mcbride ZOX:[id number] DOB: 07/21/1970 DOA: 11/22/2013  PCP: No PCP Per Patient  Admit date: 11/22/2013 Discharge date: 11/23/2013  Recommendations for Outpatient Follow-up:  1. F/u with primary care doctor within 2 weeks.  Repeat BMP at next appointment and resume NSAIDS and stop narcotics if creatinine back to baseline.  Discharge Diagnoses:  Principal Problem:   Syncope likely due to dehydration and heat exhaustion Active Problems:   Acute renal failure   Chest pain   Hypercalcemia   Leucocytosis   Heat exhaustion   Dehydration   Discharge Condition: stable, improved  Diet recommendation: healthy heart  Wt Readings from Last 3 Encounters:  11/22/13 111.131 kg (245 lb)    History of present illness:  Jonathan Mcbride is a 43 y.o. male with history of sciatica and arrhythmias presented to the [facility] with complaints of loss of consciousness and chest pain. Patient states that he was working and his work required to carry [employer] and he started developing some chest pressure and later became diaphoretic and his lower extremity started cramping and lost consciousness. He has been having some headache since yesterday which is mostly in the occipital area. His colleagues sprinkled water on him and he regained his consciousness. Since then has been feeling fatigued and weak but by then his chest pressure had resolved. He went to his home and later came to the ER. In the ER labs revealed elevated creatinine with mildly elevated CK. Patient also had leukocytosis but was afebrile. Patient has been admitted for further management. Urine drug screen is positive for marijuana and cocaine. Patient has not been taking his Neurontin and Flexeril for last 3 days as he out of his medications. On exam patient is nonfocal and presently has no chest pain denies any shortness of breath still has occipital headache.  Hospital Course:   Syncope, probably due  to dehydration and heat exhaustion.  CT head was unremarkable. He was given IV fluids. His CPK was mildly elevated in the 600 range. Monitor demonstrated normal sinus rhythm with occasional PVCs. Troponins were negative. He felt much better after IV fluids.    Chest pain, given that patient is positive for cocaine chest pain could be probably related to cocaine, however, he states he last used at party about 7 days ago.  Most likely this is also secondary to heat exhaustion. Again his telemetry did not demonstrate any significant arrhythmias and his troponins were negative. His 2-D echocardiogram demonstrated a preserved ejection fraction of 60% with normal wall motion. Left atrium is mildly to moderately dilated and he had some mild LVH. His valves were structurally normal.  He was advised to restart his atenolol, however, he was advised not to take this medication if he uses cocaine and his dose was reduced to 12.5mg  BID because of low normal blood pressures.  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   Acute renal failure and hypercalcemia - probably due to dehydration and heavy NSAID use.  Hypercalcemia resolved with IVF and creatinine trended down rapidly.  FENa << 1% c/w dehydration  Leukocytosis - dehydrated and resolved with fluids   Low back pain and sciatica - restarted Neurontin and Flexeril.  Start hydrocodone prn pain while not able to take NSAIDS.    Headache - CT head is neg, continue when necessary tramadol.   Polysubstance abuse - tobacco cessation counseling  requested and social work consult    Consultants:  None Procedures:  CT head  CXR ECHO Antibiotics:  none    Discharge Exam: Filed Vitals:   11/23/13 1418  BP: 112/70  Pulse: 78  Temp: 98.3 F (36.8 C)  Resp: 17   Filed Vitals:   11/22/13 2016 11/22/13 2200 11/23/13 0604 11/23/13 1418  BP:  117/75 123/68 112/70   Pulse: 76 92 70 78  Temp:  98.9 F (37.2 C) 97.7 F (36.5 C) 98.3 F (36.8 C)  TempSrc:  Oral Oral Oral  Resp: 18 18 18 17   Height:      Weight:      SpO2: 100% 96% 97% 97%    General: CM, No acute distress  HEENT: NCAT, MMM  Cardiovascular: RRR, nl S1, S2 no mrg, 2+ pulses, warm extremities  Respiratory: CTAB, no increased WOB  Abdomen: NABS, soft, NT/ND  MSK: Normal tone and bulk, no LEE  Neuro: Grossly intact   Discharge Instructions      Discharge Instructions   Call MD for:  difficulty breathing, headache or visual disturbances    Complete by:  As directed      Call MD for:  extreme fatigue    Complete by:  As directed      Call MD for:  hives    Complete by:  As directed      Call MD for:  persistant dizziness or light-headedness    Complete by:  As directed      Call MD for:  persistant nausea and vomiting    Complete by:  As directed      Call MD for:  severe uncontrolled pain    Complete by:  As directed      Call MD for:  temperature >100.4    Complete by:  As directed      Diet general    Complete by:  As directed      Discharge instructions    Complete by:  As directed   You were hospitalized with fainting spell from heat exhaustion and dehydration.  Please try to minimize your time in the sun, take frequent breaks, and drink plenty of fluids. Do not use cocaine, naprosyn/aleve, ibuprofen/motrin, or aspirin which can worsen kidney function.  For the next two days, please do not work or go outside and focus on drinking plenty of fluids and continuing to rehydrate yourself.  You will need repeat bloodwork at your follow up appointment with your primary care doctor to check your kidney function.  Please cut your atenolol in half until you follow up with your primary care doctor.     Increase activity slowly    Complete by:  As directed             Medication List    STOP taking these medications       ibuprofen 200 MG tablet  Commonly known as:   ADVIL,MOTRIN      TAKE these medications       albuterol 108 (90 BASE) MCG/ACT inhaler  Commonly known as:  PROVENTIL HFA;VENTOLIN HFA  Inhale 1-2 puffs into the lungs every 6 (six) hours as needed for wheezing or shortness of breath.     atenolol 25 MG tablet  Commonly known as:  TENORMIN  Take 0.5 tablets (12.5 mg total) by mouth 2 (two) times daily.     cyclobenzaprine 10 MG tablet  Commonly known as:  FLEXERIL  Take 10 mg by mouth 2 (two)  times daily.     gabapentin 800 MG tablet  Commonly known as:  NEURONTIN  Take 800 mg by mouth 3 (three) times daily.     HYDROcodone-acetaminophen 5-325 MG per tablet  Commonly known as:  NORCO/VICODIN  Take 1 tablet by mouth every 6 (six) hours as needed for moderate pain.       Follow-up Information   Follow up with [facility]     In 3 weeks.   Contact information:   [address] [place] [address] [phone]       The results of significant diagnostics from this hospitalization (including imaging, microbiology, ancillary and laboratory) are listed below for reference.    Significant Diagnostic Studies: Dg Chest 2 View  11/22/2013   CLINICAL DATA:  Loss of consciousness  EXAM: CHEST  2 VIEW  COMPARISON:  None.  FINDINGS: Cardiomediastinal silhouette is stable. Mild lower thoracic levoscoliosis. Mild degenerative changes thoracic spine. No acute infiltrate or pulmonary edema. Subtle mild compression deformity mid thoracic spine of indeterminate age.  IMPRESSION: Mild lower thoracic levoscoliosis. Mild degenerative changes thoracic spine. No acute infiltrate or pulmonary edema. Subtle mild compression deformity mid thoracic spine of indeterminate age. Clinical correlation is necessary.   Electronically Signed   By: Natasha MeadLiviu  Pop M.D.   On: 11/22/2013 19:09   Ct Head Wo Contrast  11/23/2013   CLINICAL DATA:  Headache.  EXAM: CT HEAD WITHOUT CONTRAST  TECHNIQUE: Contiguous axial images were  obtained from the base of the skull through the vertex without intravenous contrast.  COMPARISON:  None.  FINDINGS: There is no evidence of acute infarction, mass lesion, or intra- or extra-axial hemorrhage on CT.  The posterior fossa, including the cerebellum, brainstem and fourth ventricle, is within normal limits. The third and lateral ventricles, and basal ganglia are unremarkable in appearance. The cerebral hemispheres are symmetric in appearance, with normal gray-white differentiation. No mass effect or midline shift is seen.  There is no evidence of fracture; visualized osseous structures are unremarkable in appearance. The orbits are within normal limits. The paranasal sinuses and mastoid air cells are well-aerated. No significant soft tissue abnormalities are seen.  IMPRESSION: Unremarkable noncontrast CT of the head.   Electronically Signed   By: Roanna RaiderJeffery  Chang M.D.   On: 11/23/2013 02:30    Microbiology: No results found for this or any previous visit (from the past 240 hour(s)).   Labs: Basic Metabolic Panel:  Recent Labs Lab 11/22/13 1815 11/22/13 2339 11/23/13 0408  NA 139  --  144  K 4.5  --  4.0  CL 96  --  106  CO2 22  --  24  GLUCOSE 127*  --  100*  BUN 29*  --  27*  CREATININE 2.80* 1.62* 1.50*  CALCIUM 11.4*  --  9.1   Liver Function Tests:  Recent Labs Lab 11/22/13 1815 11/23/13 0408  AST 44* 34  ALT 36 25  ALKPHOS 96 81  BILITOT 0.7 0.2*  PROT 8.9* 6.6  ALBUMIN 5.4* 3.6   No results found for this basename: LIPASE, AMYLASE,  in the last 168 hours No results found for this basename: AMMONIA,  in the last 168 hours CBC:  Recent Labs Lab 11/22/13 1815 11/22/13 2339 11/23/13 0408  WBC 15.3* 12.1* 9.6  NEUTROABS 12.7*  --  5.7  HGB 16.9 14.5 13.0  HCT 47.6 41.7 38.8*  MCV 99.4 99.5 100.8*  PLT 270 232 209   Cardiac Enzymes:  Recent  Labs Lab 11/22/13 1815 11/22/13 1843 11/22/13 2339 11/23/13 0408 11/23/13 0420 11/23/13 1115  CKTOTAL  --   604*  --  678*  --   --   TROPONINI <0.30  --  <0.30  --  <0.30 <0.30   BNP: BNP (last 3 results) No results found for this basename: PROBNP,  in the last 8760 hours CBG: No results found for this basename: GLUCAP,  in the last 168 hours  Time coordinating discharge: 45 minutes  Signed:  Lacoya Wilbanks  [facility] 11/23/2013, 4:45 PM

## 2013-11-23 NOTE — Progress Notes (Signed)
Discharge instructions, medications and follow up appointments are reviewed with patient and his wife. Both voice understanding to teaching. Copies of D/C instructions and prescriptions given to patient.  Ambulatory to door.  Home via POV with his wife driving.

## 2013-11-23 NOTE — Progress Notes (Signed)
[facility] PROGRESS NOTE  Jonathan AdjutantJoseph Mcbride ZOX:[id number] DOB: 06/01/1971 DOA: 11/22/2013 PCP: No PCP Per Patient  Assessment/Plan  Syncope - probably seen to dehydration and heat exhaustion.  -  Continue with aggressive IV hydration. -  CK levels still elevated -  Telemetry:  PVC, otherwise unremarkable -  troponins negative   Chest pain - given that patient is positive for cocaine chest pain could be probably related to cocaine, however, he states he last used at party about 7 days ago.   -  Telemetry:  PVC -  troponins negative  -  Check 2-D echo.  -  Restart atenolol.  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  Acute renal failure and hypercalcemia - probably seen to dehydration.  - Improving with IVF -  FENa << 1% c/w dehydration  Leukocytosis - dehydrated and resolved with fluids  Low back pain and sciatica - I have placed patient back on his Neurontin and Flexeril.   Headache - CT head is neg, continue when necessary tramadol.   Polysubstance abuse - tobacco cessation counseling requested and social work consult  Diet:  Healthy heart Access:  PIV IVF:  yes Proph:  lovenox  Code Status: full Family Communication: patient alone Disposition Plan: later today to home if ECHO normal   Consultants:  None  Procedures:  CT head  CXR  Antibiotics:  none   HPI/Subjective:  Denies HA, chest pain, SOB.      Objective: Filed Vitals:   11/22/13 1816 11/22/13 2016 11/22/13 2200 11/23/13 0604  BP: 151/79  117/75 123/68  Pulse: 93 76 92 70  Temp: 99 F (37.2 C)  98.9 F (37.2 C) 97.7 F (36.5 C)  TempSrc: Oral  Oral Oral  Resp: 16 18 18 18   Height: 6\' 2"  (1.88 m)     Weight: 111.131 kg (245 lb)     SpO2: 99% 100% 96% 97%    Intake/Output Summary (Last 24 hours) at 11/23/13 0944 Last data filed at 11/23/13 0816  Anneli Bing per 24 hour  Intake   2604 ml  Output    1025 ml  Net   1579 ml   Filed Weights   11/22/13 1816  Weight: 111.131 kg (245 lb)    Exam:   General:  CM, No acute distress  HEENT:  NCAT, MMM  Cardiovascular:  RRR, nl S1, S2 no mrg, 2+ pulses, warm extremities  Respiratory:  CTAB, no increased WOB  Abdomen:   NABS, soft, NT/ND  MSK:   Normal tone and bulk, no LEE  Neuro:  Grossly intact  Data Reviewed: Basic Metabolic Panel:  Recent Labs Lab 11/22/13 1815 11/22/13 2339 11/23/13 0408  NA 139  --  144  K 4.5  --  4.0  CL 96  --  106  CO2 22  --  24  GLUCOSE 127*  --  100*  BUN 29*  --  27*  CREATININE 2.80* 1.62* 1.50*  CALCIUM 11.4*  --  9.1   Liver Function Tests:  Recent Labs Lab 11/22/13 1815 11/23/13 0408  AST 44* 34  ALT 36 25  ALKPHOS 96 81  BILITOT 0.7 0.2*  PROT 8.9* 6.6  ALBUMIN 5.4* 3.6   No results found for this basename: LIPASE, AMYLASE,  in the last 168 hours No results found for this basename: AMMONIA,  in the last 168 hours CBC:  Recent Labs Lab 11/22/13  1815 11/22/13 2339 11/23/13 0408  WBC 15.3* 12.1* 9.6  NEUTROABS 12.7*  --  5.7  HGB 16.9 14.5 13.0  HCT 47.6 41.7 38.8*  MCV 99.4 99.5 100.8*  PLT 270 232 209   Cardiac Enzymes:  Recent Labs Lab 11/22/13 1815 11/22/13 1843 11/22/13 2339 11/23/13 0408 11/23/13 0420  CKTOTAL  --  604*  --  678*  --   TROPONINI <0.30  --  <0.30  --  <0.30   BNP (last 3 results) No results found for this basename: PROBNP,  in the last 8760 hours CBG: No results found for this basename: GLUCAP,  in the last 168 hours  No results found for this or any previous visit (from the past 240 hour(s)).   Studies: Dg Chest 2 View  11/22/2013   CLINICAL DATA:  Loss of consciousness  EXAM: CHEST  2 VIEW  COMPARISON:  None.  FINDINGS: Cardiomediastinal silhouette is stable. Mild lower thoracic levoscoliosis. Mild degenerative changes thoracic spine. No acute infiltrate or pulmonary edema. Subtle mild compression deformity mid thoracic  spine of indeterminate age.  IMPRESSION: Mild lower thoracic levoscoliosis. Mild degenerative changes thoracic spine. No acute infiltrate or pulmonary edema. Subtle mild compression deformity mid thoracic spine of indeterminate age. Clinical correlation is necessary.   Electronically Signed   By: Natasha MeadLiviu  Pop M.D.   On: 11/22/2013 19:09   Ct Head Wo Contrast  11/23/2013   CLINICAL DATA:  Headache.  EXAM: CT HEAD WITHOUT CONTRAST  TECHNIQUE: Contiguous axial images were obtained from the base of the skull through the vertex without intravenous contrast.  COMPARISON:  None.  FINDINGS: There is no evidence of acute infarction, mass lesion, or intra- or extra-axial hemorrhage on CT.  The posterior fossa, including the cerebellum, brainstem and fourth ventricle, is within normal limits. The third and lateral ventricles, and basal ganglia are unremarkable in appearance. The cerebral hemispheres are symmetric in appearance, with normal gray-white differentiation. No mass effect or midline shift is seen.  There is no evidence of fracture; visualized osseous structures are unremarkable in appearance. The orbits are within normal limits. The paranasal sinuses and mastoid air cells are well-aerated. No significant soft tissue abnormalities are seen.  IMPRESSION: Unremarkable noncontrast CT of the head.   Electronically Signed   By: Roanna RaiderJeffery  Chang M.D.   On: 11/23/2013 02:30    Scheduled Meds: . aspirin  325 mg Oral Daily  . cyclobenzaprine  5 mg Oral BID  . enoxaparin (LOVENOX) injection  40 mg Subcutaneous QHS  . gabapentin  800 mg Oral TID  . pneumococcal 23 valent vaccine  0.5 mL Intramuscular Tomorrow-1000  . sodium chloride  3 mL Intravenous Q12H   Continuous Infusions: . sodium chloride 150 mL/hr at 11/23/13 [id number]    Principal Problem:   Syncope Active Problems:   Acute renal failure   Chest pain   Hypercalcemia   Leucocytosis    Time spent: 30 min    SHORT, Gpddc LLCMACKENZIE  [facility] Pager [phone]. If 7PM-7AM, please contact night-coverage at [email or web address], password [email or web address] 11/23/2013, 9:44 AM  LOS: 1 day

## 2013-11-23 NOTE — Progress Notes (Signed)
Clinical Social Work Department BRIEF PSYCHOSOCIAL ASSESSMENT 11/23/2013  Patient:  Jonathan Mcbride,Jonathan Mcbride     Account Number:  [id number]     Admit date:  11/22/2013  Clinical Social Worker:  Harless NakayamaAMBELAL,POONUM, LCSWA  Date/Time:  11/23/2013 03:15 PM  Referred by:  Physician  Date Referred:  11/23/2013 Referred for  Substance Abuse   Other Referral:   Interview type:  Patient Other interview type:    PSYCHOSOCIAL DATA Living Status:  WIFE Admitted from facility:   Level of care:   Primary support name:  Jonathan ParisianWanda Mcbride [phone] Primary support relationship to patient:  SPOUSE Degree of support available:   Pt reports having excellent support system    CURRENT CONCERNS Current Concerns  Substance Abuse   Other Concerns:    SOCIAL WORK ASSESSMENT / PLAN CSW visited pt room and spoke with pt about substance use. CSW completed SBIRT. Pt reports using marijuana and alcohol. Pt stated he only uses marijuana occassionally and had only used it once recently which was before admission. Pt informed CSW he drinks alcohol a few times a month. Though pt stated he does not feel he has a problem he does admit understanding that substance use is effecting is health. CSW offered pt substance abuse resources. Pt accepted list but informed CSW he felt his biggest support would come from returning to church. CSW encouraged pt to keep list but use whatever form of assistance he feels would work best for him. Pt plans on returning home with his wife who he acknowledges as one of his biggest supports. Pt thankful for CSW visit and informed CSW this hospital visit has changed his perception and opened his eyes. CSW has provided pt with available resources. Pt has no further hospital social work needs. CSW signing off.   Assessment/plan status:  No Further Intervention Required Other assessment/ plan:   Information/referral to community resources:   Substance abuse treatment list    PATIENT'S/FAMILY'S RESPONSE TO  PLAN OF CARE: Pt is agreeable to discharging home with wife and using available resources       Sharol Harnessoonum Ambelal, LCSWA [phone]

## 2013-11-23 NOTE — Progress Notes (Signed)
UR Completed Brenda Graves-Bigelow, RN,BSN [phone]  

## 2013-11-24 LAB — PARATHYROID HORMONE, INTACT (NO CA): PTH: 32.4 pg/mL (ref 14.0–72.0)

## 2013-11-24 LAB — VITAMIN D 25 HYDROXY (VIT D DEFICIENCY, FRACTURES): Vit D, 25-Hydroxy: 39 ng/mL (ref 30–89)

## 2017-08-12 ENCOUNTER — Other Ambulatory Visit: Payer: Self-pay

## 2017-08-12 ENCOUNTER — Emergency Department (HOSPITAL_BASED_OUTPATIENT_CLINIC_OR_DEPARTMENT_OTHER)
Admission: EM | Admit: 2017-08-12 | Discharge: 2017-08-12 | Disposition: A | Discharge status: Home / Self Care | Payer: Self-pay | Attending: Emergency Medicine | Admitting: Emergency Medicine

## 2017-08-12 ENCOUNTER — Encounter (HOSPITAL_BASED_OUTPATIENT_CLINIC_OR_DEPARTMENT_OTHER): Payer: Self-pay | Admitting: *Deleted

## 2017-08-12 ENCOUNTER — Emergency Department (HOSPITAL_BASED_OUTPATIENT_CLINIC_OR_DEPARTMENT_OTHER): Payer: Self-pay

## 2017-08-12 DIAGNOSIS — Y92002 Bathroom of unspecified non-institutional (private) residence single-family (private) house as the place of occurrence of the external cause: Secondary | ICD-10-CM | POA: Insufficient documentation

## 2017-08-12 DIAGNOSIS — Z79899 Other long term (current) drug therapy: Secondary | ICD-10-CM | POA: Insufficient documentation

## 2017-08-12 DIAGNOSIS — J449 Chronic obstructive pulmonary disease, unspecified: Secondary | ICD-10-CM | POA: Insufficient documentation

## 2017-08-12 DIAGNOSIS — W19XXXA Unspecified fall, initial encounter: Secondary | ICD-10-CM

## 2017-08-12 DIAGNOSIS — W0110XA Fall on same level from slipping, tripping and stumbling with subsequent striking against unspecified object, initial encounter: Secondary | ICD-10-CM | POA: Insufficient documentation

## 2017-08-12 DIAGNOSIS — M545 Low back pain: Secondary | ICD-10-CM | POA: Insufficient documentation

## 2017-08-12 DIAGNOSIS — M25511 Pain in right shoulder: Secondary | ICD-10-CM | POA: Insufficient documentation

## 2017-08-12 DIAGNOSIS — F17210 Nicotine dependence, cigarettes, uncomplicated: Secondary | ICD-10-CM | POA: Insufficient documentation

## 2017-08-12 DIAGNOSIS — I10 Essential (primary) hypertension: Secondary | ICD-10-CM | POA: Insufficient documentation

## 2017-08-12 MED ORDER — OXYCODONE-ACETAMINOPHEN 5-325 MG PO TABS
2.0000 | ORAL_TABLET | Freq: Once | ORAL | Status: AC
  Administered 2017-08-12: 2 via ORAL
  Filled 2017-08-12: qty 2

## 2017-08-12 MED ORDER — METHYLPREDNISOLONE 4 MG PO TBPK: ORAL_TABLET | ORAL | 0 refills | Status: DC

## 2017-08-12 MED ORDER — HYDROCODONE-ACETAMINOPHEN 5-325 MG PO TABS: 1.0000 | ORAL_TABLET | Freq: Four times a day (QID) | ORAL | 0 refills | Status: DC | PRN

## 2017-08-12 MED FILL — METHYLPREDNISOLONE 4 MG TAB: 4 | 6 days supply | Qty: 21 | Fill #0

## 2017-08-12 MED FILL — HYDROCODON-APAP 5-325: 5-325 | 1 days supply | Qty: 5 | Fill #0
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
## 2017-08-12 NOTE — ED Notes (Signed)
Patient transported to X-ray 

## 2017-08-12 NOTE — Discharge Instructions (Signed)
It was my pleasure taking care of you today!   Tylenol (650mg ) three times a day for pain. Take steroid taper as directed for pain relief as well. Ice or heat to the affected area will help with pain too.   Fortunately, your x-ray's today did not show any injury from your fall, however I do recommend that you follow up with the orthopedist for further evaluation of your symptoms. Please call the doctor listed today or tomorrow morning to schedule a follow up appointment.   Return to the ED for worsening back pain, fever, weakness of either leg, or if you develop either (1) an inability to urinate or have bowel movements, or (2) loss of your ability to control your bathroom functions (if you start having "accidents"), or if you develop other new symptoms that concern you.

## 2017-08-12 NOTE — ED Triage Notes (Signed)
States he slipped in water 6 days ago fell. Injury to his right shoulder, lower back with radiation into his right leg. Pain in his right knee. He is ambulatory with no difficulty.

## 2017-08-23 ENCOUNTER — Other Ambulatory Visit (HOSPITAL_COMMUNITY): Payer: Self-pay | Admitting: Orthopedic Surgery

## 2017-08-23 DIAGNOSIS — M545 Low back pain: Principal | ICD-10-CM

## 2017-08-23 DIAGNOSIS — G8929 Other chronic pain: Secondary | ICD-10-CM

## 2017-08-28 ENCOUNTER — Encounter (HOSPITAL_COMMUNITY): Payer: Self-pay

## 2017-08-28 ENCOUNTER — Ambulatory Visit (HOSPITAL_COMMUNITY)
Admission: RE | Admit: 2017-08-28 | Discharge: 2017-08-28 | Disposition: A | Discharge status: Home / Self Care | Payer: Self-pay | Source: Ambulatory Visit | Attending: Orthopedic Surgery | Admitting: Orthopedic Surgery

## 2017-08-28 DIAGNOSIS — G8929 Other chronic pain: Secondary | ICD-10-CM

## 2017-08-28 DIAGNOSIS — M545 Low back pain: Principal | ICD-10-CM

## 2017-09-01 ENCOUNTER — Other Ambulatory Visit (HOSPITAL_COMMUNITY): Payer: Self-pay | Admitting: Orthopedic Surgery

## 2017-09-01 DIAGNOSIS — G8929 Other chronic pain: Secondary | ICD-10-CM

## 2017-09-01 DIAGNOSIS — M545 Low back pain: Principal | ICD-10-CM

## 2017-09-02 ENCOUNTER — Ambulatory Visit (HOSPITAL_COMMUNITY)
Admission: RE | Admit: 2017-09-02 | Discharge: 2017-09-02 | Disposition: A | Discharge status: Home / Self Care | Payer: Self-pay | Source: Ambulatory Visit | Attending: Orthopedic Surgery | Admitting: Orthopedic Surgery

## 2017-09-02 DIAGNOSIS — M545 Low back pain: Secondary | ICD-10-CM | POA: Insufficient documentation

## 2017-09-02 DIAGNOSIS — M48061 Spinal stenosis, lumbar region without neurogenic claudication: Secondary | ICD-10-CM | POA: Insufficient documentation

## 2017-09-02 DIAGNOSIS — M5136 Other intervertebral disc degeneration, lumbar region: Secondary | ICD-10-CM | POA: Insufficient documentation

## 2017-09-02 DIAGNOSIS — G8929 Other chronic pain: Secondary | ICD-10-CM | POA: Insufficient documentation

## 2019-10-15 ENCOUNTER — Other Ambulatory Visit: Payer: Self-pay

## 2019-10-15 ENCOUNTER — Emergency Department (HOSPITAL_BASED_OUTPATIENT_CLINIC_OR_DEPARTMENT_OTHER): Payer: Medicaid Other

## 2019-10-15 ENCOUNTER — Encounter (HOSPITAL_BASED_OUTPATIENT_CLINIC_OR_DEPARTMENT_OTHER): Payer: Self-pay | Admitting: Emergency Medicine

## 2019-10-15 ENCOUNTER — Emergency Department (HOSPITAL_BASED_OUTPATIENT_CLINIC_OR_DEPARTMENT_OTHER)
Admission: EM | Admit: 2019-10-15 | Discharge: 2019-10-15 | Disposition: A | Discharge status: Home / Self Care | Payer: Medicaid Other | Attending: Emergency Medicine | Admitting: Emergency Medicine

## 2019-10-15 DIAGNOSIS — I10 Essential (primary) hypertension: Secondary | ICD-10-CM | POA: Diagnosis not present

## 2019-10-15 DIAGNOSIS — F17210 Nicotine dependence, cigarettes, uncomplicated: Secondary | ICD-10-CM | POA: Diagnosis not present

## 2019-10-15 DIAGNOSIS — R079 Chest pain, unspecified: Secondary | ICD-10-CM | POA: Diagnosis not present

## 2019-10-15 DIAGNOSIS — J449 Chronic obstructive pulmonary disease, unspecified: Secondary | ICD-10-CM | POA: Diagnosis not present

## 2019-10-15 DIAGNOSIS — Z79899 Other long term (current) drug therapy: Secondary | ICD-10-CM | POA: Insufficient documentation

## 2019-10-15 HISTORY — DX: Depression, unspecified: F32.A

## 2019-10-15 HISTORY — DX: Other psychoactive substance abuse, uncomplicated: F19.10

## 2019-10-15 LAB — COMPREHENSIVE METABOLIC PANEL
ALT: 26 U/L (ref 0–44)
AST: 27 U/L (ref 15–41)
Albumin: 3.8 g/dL (ref 3.5–5.0)
Alkaline Phosphatase: 79 U/L (ref 38–126)
Anion gap: 9 (ref 5–15)
BUN: 15 mg/dL (ref 6–20)
CO2: 25 mmol/L (ref 22–32)
Calcium: 9.1 mg/dL (ref 8.9–10.3)
Chloride: 105 mmol/L (ref 98–111)
Creatinine, Ser: 0.89 mg/dL (ref 0.61–1.24)
GFR calc Af Amer: >60 mL/min (ref >60)
GFR calc non Af Amer: >60 mL/min (ref >60)
Glucose, Bld: 105 mg/dL — ABNORMAL HIGH (ref 70–99)
Potassium: 3.7 mmol/L (ref 3.5–5.1)
Sodium: 139 mmol/L (ref 135–145)
Total Bilirubin: 0.5 mg/dL (ref 0.3–1.2)
Total Protein: 6.8 g/dL (ref 6.5–8.1)

## 2019-10-15 LAB — CBC
HCT: 42.1 % (ref 39.0–52.0)
Hemoglobin: 14.4 g/dL (ref 13.0–17.0)
MCH: 34.8 pg — ABNORMAL HIGH (ref 26.0–34.0)
MCHC: 34.2 g/dL (ref 30.0–36.0)
MCV: 101.7 fL — ABNORMAL HIGH (ref 80.0–100.0)
Platelets: 258 10*3/uL (ref 150–400)
RBC: 4.14 MIL/uL — ABNORMAL LOW (ref 4.22–5.81)
RDW: 13.6 % (ref 11.5–15.5)
WBC: 8.7 10*3/uL (ref 4.0–10.5)
nRBC: 0 % (ref 0.0–0.2)

## 2019-10-15 LAB — TROPONIN I (HIGH SENSITIVITY): Troponin I (High Sensitivity): 5 ng/L (ref <18)

## 2019-10-15 MED ORDER — LIDOCAINE VISCOUS HCL 2 % MT SOLN
15.0000 mL | Freq: Once | OROMUCOSAL | Status: AC
  Administered 2019-10-15: 04:00:00 15 mL via ORAL
  Filled 2019-10-15: qty 15

## 2019-10-15 MED ORDER — ACETAMINOPHEN 500 MG PO TABS
1000.0000 mg | ORAL_TABLET | Freq: Once | ORAL | Status: AC
  Administered 2019-10-15: 1000 mg via ORAL
  Filled 2019-10-15: qty 2

## 2019-10-15 MED ORDER — SODIUM CHLORIDE 0.9 % IV BOLUS (SEPSIS)
1000.0000 mL | Freq: Once | INTRAVENOUS | Status: AC
Start: 2019-10-15 — End: 2019-10-15
  Administered 2019-10-15: 1000 mL via INTRAVENOUS

## 2019-10-15 MED ORDER — HYDROXYZINE HCL 25 MG PO TABS
25.0000 mg | ORAL_TABLET | Freq: Once | ORAL | Status: AC
  Administered 2019-10-15: 25 mg via ORAL
  Filled 2019-10-15: qty 1

## 2019-10-15 MED ORDER — ALUM & MAG HYDROXIDE-SIMETH 200-200-20 MG/5ML PO SUSP
30.0000 mL | Freq: Once | ORAL | Status: AC
  Administered 2019-10-15: 30 mL via ORAL
  Filled 2019-10-15: qty 30

## 2019-10-15 MED ORDER — SODIUM CHLORIDE 0.9 % IV SOLN: 1000.0000 mL | INTRAVENOUS | Status: DC

## 2019-10-15 MED ORDER — ASPIRIN 81 MG PO CHEW
324.0000 mg | CHEWABLE_TABLET | Freq: Once | ORAL | Status: AC
  Administered 2019-10-15: 324 mg via ORAL
  Filled 2019-10-15: qty 4

## 2019-10-15 NOTE — ED Triage Notes (Signed)
Pt reports left sided chest pain since yesterday, radiating to back and right arm. Pt reports doing cocaine every day for twelve weeks, last dose two days ago.
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
## 2019-10-15 NOTE — ED Notes (Signed)
EDP [place] at bedside  

## 2019-12-25 ENCOUNTER — Other Ambulatory Visit: Payer: Self-pay

## 2019-12-25 ENCOUNTER — Emergency Department (HOSPITAL_COMMUNITY)
Admission: EM | Admit: 2019-12-25 | Discharge: 2019-12-26 | Disposition: A | Discharge status: Home / Self Care | Payer: Medicaid Other | Attending: Emergency Medicine | Admitting: Emergency Medicine

## 2019-12-25 ENCOUNTER — Encounter (HOSPITAL_COMMUNITY): Payer: Self-pay | Admitting: *Deleted

## 2019-12-25 ENCOUNTER — Emergency Department (HOSPITAL_COMMUNITY): Payer: Medicaid Other

## 2019-12-25 DIAGNOSIS — Z79899 Other long term (current) drug therapy: Secondary | ICD-10-CM | POA: Insufficient documentation

## 2019-12-25 DIAGNOSIS — E669 Obesity, unspecified: Secondary | ICD-10-CM | POA: Insufficient documentation

## 2019-12-25 DIAGNOSIS — F17210 Nicotine dependence, cigarettes, uncomplicated: Secondary | ICD-10-CM | POA: Insufficient documentation

## 2019-12-25 DIAGNOSIS — R410 Disorientation, unspecified: Secondary | ICD-10-CM | POA: Insufficient documentation

## 2019-12-25 DIAGNOSIS — J449 Chronic obstructive pulmonary disease, unspecified: Secondary | ICD-10-CM | POA: Diagnosis not present

## 2019-12-25 DIAGNOSIS — R0789 Other chest pain: Secondary | ICD-10-CM | POA: Diagnosis not present

## 2019-12-25 DIAGNOSIS — I10 Essential (primary) hypertension: Secondary | ICD-10-CM | POA: Diagnosis not present

## 2019-12-25 DIAGNOSIS — R079 Chest pain, unspecified: Secondary | ICD-10-CM

## 2019-12-25 DIAGNOSIS — Z7982 Long term (current) use of aspirin: Secondary | ICD-10-CM | POA: Insufficient documentation

## 2019-12-25 LAB — CBC
HCT: 44.8 % (ref 39.0–52.0)
Hemoglobin: 14.9 g/dL (ref 13.0–17.0)
MCH: 33.3 pg (ref 26.0–34.0)
MCHC: 33.3 g/dL (ref 30.0–36.0)
MCV: 100.2 fL — ABNORMAL HIGH (ref 80.0–100.0)
Platelets: 227 10*3/uL (ref 150–400)
RBC: 4.47 MIL/uL (ref 4.22–5.81)
RDW: 13.1 % (ref 11.5–15.5)
WBC: 10.6 10*3/uL — ABNORMAL HIGH (ref 4.0–10.5)
nRBC: 0 % (ref 0.0–0.2)

## 2019-12-25 LAB — BASIC METABOLIC PANEL
Anion gap: 9 (ref 5–15)
BUN: 14 mg/dL (ref 6–20)
CO2: 28 mmol/L (ref 22–32)
Calcium: 9.3 mg/dL (ref 8.9–10.3)
Chloride: 105 mmol/L (ref 98–111)
Creatinine, Ser: 0.81 mg/dL (ref 0.61–1.24)
GFR calc Af Amer: >60 mL/min (ref >60)
GFR calc non Af Amer: >60 mL/min (ref >60)
Glucose, Bld: 99 mg/dL (ref 70–99)
Potassium: 4 mmol/L (ref 3.5–5.1)
Sodium: 142 mmol/L (ref 135–145)

## 2019-12-25 LAB — TROPONIN I (HIGH SENSITIVITY): Troponin I (High Sensitivity): 8 ng/L (ref <18)

## 2019-12-25 MED ORDER — SODIUM CHLORIDE 0.9% FLUSH: 3.0000 mL | Freq: Once | INTRAVENOUS | Status: DC

## 2019-12-25 NOTE — ED Triage Notes (Addendum)
Pt arrived by [facility] for right side chest wall pain, increases with palpation. No distress is noted at triage. Reports not taking any of his meds for over 1 week. Was seen at a diff hospital today for same.

## 2019-12-26 LAB — HEPATIC FUNCTION PANEL
ALT: 38 U/L (ref 0–44)
AST: 41 U/L (ref 15–41)
Albumin: 3.8 g/dL (ref 3.5–5.0)
Alkaline Phosphatase: 93 U/L (ref 38–126)
Bilirubin, Direct: <0.1 mg/dL (ref 0.0–0.2)
Total Bilirubin: 0.5 mg/dL (ref 0.3–1.2)
Total Protein: 6.7 g/dL (ref 6.5–8.1)

## 2019-12-26 LAB — RAPID URINE DRUG SCREEN, HOSP PERFORMED
Amphetamines: NOT DETECTED
Barbiturates: NOT DETECTED
Benzodiazepines: NOT DETECTED
Cocaine: NOT DETECTED
Opiates: NOT DETECTED
Tetrahydrocannabinol: NOT DETECTED

## 2019-12-26 LAB — TROPONIN I (HIGH SENSITIVITY): Troponin I (High Sensitivity): 9 ng/L (ref <18)

## 2019-12-26 LAB — ACETAMINOPHEN LEVEL: Acetaminophen (Tylenol), Serum: <10 ug/mL — ABNORMAL LOW (ref 10–30)

## 2019-12-26 LAB — ETHANOL: Alcohol, Ethyl (B): <10 mg/dL (ref <10)

## 2019-12-26 LAB — SALICYLATE LEVEL: Salicylate Lvl: <7 mg/dL — ABNORMAL LOW (ref 7.0–30.0)

## 2019-12-26 LAB — AMMONIA: Ammonia: 22 umol/L (ref 9–35)

## 2019-12-26 MED ORDER — VENLAFAXINE HCL ER 150 MG PO CP24: 300.0000 mg | ORAL_CAPSULE | Freq: Every day | ORAL | 0 refills | Status: DC

## 2019-12-26 MED ORDER — LAMOTRIGINE 100 MG PO TABS: 100.0000 mg | ORAL_TABLET | Freq: Every day | ORAL | 0 refills | Status: DC

## 2019-12-26 MED ORDER — PRAZOSIN HCL 1 MG PO CAPS: 1.0000 mg | ORAL_CAPSULE | Freq: Every day | ORAL | 0 refills | Status: DC

## 2019-12-26 MED ORDER — DICLOFENAC SODIUM 1 % EX GEL: 2.0000 g | Freq: Four times a day (QID) | CUTANEOUS | 0 refills | Status: DC

## 2019-12-26 MED ORDER — KETOROLAC TROMETHAMINE 15 MG/ML IJ SOLN
15.0000 mg | Freq: Once | INTRAMUSCULAR | Status: AC
  Administered 2019-12-26: 15 mg via INTRAMUSCULAR
  Filled 2019-12-26: qty 1

## 2019-12-26 NOTE — Discharge Instructions (Signed)
Use Voltaren gel in your chest to help with pain. You will likely have continued pain for the next several days to weeks.  Use Tylenol and ibuprofen as needed for pain. Take your medication as prescribed. I gave you a week of your psychiatric medications that are I am able to prescribe.  You will need to follow-up with your primary care doctor for refills of all of your other medications.  You can follow-up with the clinic in [place], or you can call the office listed below to set up a follow-up appointment. There is also information about psychiatric offices and facilities in the paperwork.  They may be able to see you sooner for refills. Return to the emergency room with any new, worsening, concerning symptoms.

## 2019-12-26 NOTE — ED Notes (Signed)
Pt lethargic, repeatedly falls asleep during assessment. States he has been drinking energy drinks all night and is crashing. Pt reports taking more gabapentin than prescribed but does not give a clear answer when asked how many. Will continue to monitor level of consciousness, SpO2, and respiratory rate.

## 2019-12-26 NOTE — ED Notes (Signed)
Pt came back into the lobby, states that he was sitting outside.

## 2019-12-26 NOTE — ED Notes (Signed)
Reviewed discharge papers with patient including prescriptions, available community resources, and follow up care. Patient has no further questions for ED staff at this time. Given food and drink.

## 2019-12-26 NOTE — ED Notes (Signed)
Pt called multiple times no response  
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
## 2019-12-26 NOTE — ED Notes (Signed)
Pt state "if someone does not call my PCP and I don't get my medicine refilled I will flip shit".

## 2020-01-25 ENCOUNTER — Encounter (HOSPITAL_COMMUNITY): Payer: Self-pay | Admitting: Emergency Medicine

## 2020-01-25 ENCOUNTER — Emergency Department (HOSPITAL_COMMUNITY): Payer: Medicaid Other

## 2020-01-25 ENCOUNTER — Emergency Department (HOSPITAL_COMMUNITY)
Admission: EM | Admit: 2020-01-25 | Discharge: 2020-01-26 | Disposition: A | Discharge status: Home / Self Care | Payer: Medicaid Other | Attending: Emergency Medicine | Admitting: Emergency Medicine

## 2020-01-25 ENCOUNTER — Other Ambulatory Visit: Payer: Self-pay

## 2020-01-25 DIAGNOSIS — Z5321 Procedure and treatment not carried out due to patient leaving prior to being seen by health care provider: Secondary | ICD-10-CM | POA: Diagnosis not present

## 2020-01-25 DIAGNOSIS — R000 Tachycardia, unspecified: Secondary | ICD-10-CM | POA: Insufficient documentation

## 2020-01-25 LAB — BASIC METABOLIC PANEL
Anion gap: 8 (ref 5–15)
BUN: 12 mg/dL (ref 6–20)
CO2: 26 mmol/L (ref 22–32)
Calcium: 8.8 mg/dL — ABNORMAL LOW (ref 8.9–10.3)
Chloride: 103 mmol/L (ref 98–111)
Creatinine, Ser: 0.87 mg/dL (ref 0.61–1.24)
GFR calc Af Amer: >60 mL/min (ref >60)
GFR calc non Af Amer: >60 mL/min (ref >60)
Glucose, Bld: 112 mg/dL — ABNORMAL HIGH (ref 70–99)
Potassium: 4.4 mmol/L (ref 3.5–5.1)
Sodium: 137 mmol/L (ref 135–145)

## 2020-01-25 LAB — TROPONIN I (HIGH SENSITIVITY): Troponin I (High Sensitivity): 13 ng/L (ref <18)

## 2020-01-25 LAB — CBC
HCT: 43 % (ref 39.0–52.0)
Hemoglobin: 14.1 g/dL (ref 13.0–17.0)
MCH: 32.7 pg (ref 26.0–34.0)
MCHC: 32.8 g/dL (ref 30.0–36.0)
MCV: 99.8 fL (ref 80.0–100.0)
Platelets: 211 10*3/uL (ref 150–400)
RBC: 4.31 MIL/uL (ref 4.22–5.81)
RDW: 13.2 % (ref 11.5–15.5)
WBC: 17.8 10*3/uL — ABNORMAL HIGH (ref 4.0–10.5)
nRBC: 0 % (ref 0.0–0.2)

## 2020-01-25 NOTE — ED Triage Notes (Signed)
BIB EMS Pt reports drinking 10 Bang energy drinks yesterday and 4 more today. Hx of block and needs an ablation. He notes he has a cardiology appointment tomorrow. PTA give bolus ns. HR now 163. Denies chest pain.

## 2020-01-26 NOTE — ED Notes (Signed)
No answer x2 for vitals

## 2020-01-26 NOTE — ED Notes (Signed)
No answer for vitals recheck x1 
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
## 2020-01-30 ENCOUNTER — Ambulatory Visit (INDEPENDENT_AMBULATORY_CARE_PROVIDER_SITE_OTHER): Payer: Medicaid Other | Admitting: Cardiology

## 2020-01-30 ENCOUNTER — Other Ambulatory Visit: Payer: Self-pay

## 2020-01-30 ENCOUNTER — Encounter: Payer: Self-pay | Admitting: Cardiology

## 2020-01-30 VITALS — BP 122/85 | HR 84 | Ht 74.0 in | Wt 262.2 lb

## 2020-01-30 DIAGNOSIS — Z13220 Encounter for screening for lipoid disorders: Secondary | ICD-10-CM | POA: Diagnosis not present

## 2020-01-30 DIAGNOSIS — Z7189 Other specified counseling: Secondary | ICD-10-CM

## 2020-01-30 DIAGNOSIS — I495 Sick sinus syndrome: Secondary | ICD-10-CM

## 2020-01-30 DIAGNOSIS — R072 Precordial pain: Secondary | ICD-10-CM

## 2020-01-30 DIAGNOSIS — R0602 Shortness of breath: Secondary | ICD-10-CM

## 2020-01-30 NOTE — Patient Instructions (Addendum)
Medication Instructions:  Your physician recommends that you continue on your current medications as directed. Please refer to the Current Medication list given to you today.  *If you need a refill on your cardiac medications before your next appointment, please call your pharmacy*  Lab Work: FASTING LP/BNP WHEN YOU RETURN FOR YOUR LEXISCAN   If you have labs (blood work) drawn today and your tests are completely normal, you will receive your results only by: Marland Kitchen MyChart Message (if you have MyChart) OR . A paper copy in the mail If you have any lab test that is abnormal or we need to change your treatment, we will call you to review the results.  Testing/Procedures: Your physician has requested that you have a lexiscan myoview. For further information please visit [email or web address] Please follow instruction sheet, as given.  Follow-Up: At [facility], you and your health needs are our priority.  As part of our continuing mission to provide you with exceptional heart care, we have created designated Provider Care Teams.  These Care Teams include your primary Cardiologist (physician) and Advanced Practice Providers (APPs -  Physician Assistants and Nurse Practitioners) who all work together to provide you with the care you need, when you need it.  We recommend signing up for the patient portal called "MyChart".  Sign up information is provided on this After Visit Summary.  MyChart is used to connect with patients for Virtual Visits (Telemedicine).  Patients are able to view lab/test results, encounter notes, upcoming appointments, etc.  Non-urgent messages can be sent to your provider as well.   To learn more about what you can do with MyChart, go to [email or web address].    Your next appointment:   3 month(s)         The format for your next appointment:   In Person    Provider:   DR [facility]    THE OFFICE WILL CALL YOU TO SCHEDULE ABOVE APPOINTMENT   Other  Instructions    Cardiac Nuclear Scan A cardiac nuclear scan is a test that is done to check the flow of blood to your heart. It is done when you are resting and when you are exercising. The test looks for problems such as:  Not enough blood reaching a portion of the heart.  The heart muscle not working as it should. You may need this test if:  You have heart disease.  You have had lab results that are not normal.  You have had heart surgery or a balloon procedure to open up blocked arteries (angioplasty).  You have chest pain.  You have shortness of breath. In this test, a special dye (tracer) is put into your bloodstream. The tracer will travel to your heart. A camera will then take pictures of your heart to see how the tracer moves through your heart. This test is usually done at a hospital and takes 2-4 hours. Tell a doctor about:  Any allergies you have.  All medicines you are taking, including vitamins, herbs, eye drops, creams, and over-the-counter medicines.  Any problems you or family members have had with anesthetic medicines.  Any blood disorders you have.  Any surgeries you have had.  Any medical conditions you have.  Whether you are pregnant or may be pregnant. What are the risks? Generally, this is a safe test. However, problems may occur, such as:  Serious chest pain and heart attack. This is only a risk if the stress portion of the test is done.  Rapid  heartbeat.  A feeling of warmth in your chest. This feeling usually does not last long.  Allergic reaction to the tracer. What happens before the test?  Ask your doctor about changing or stopping your normal medicines. This is important.  Follow instructions from your doctor about what you cannot eat or drink.  Remove your jewelry on the day of the test. What happens during the test?  An IV tube will be inserted into one of your veins.  Your doctor will give you a small amount of tracer through  the IV tube.  You will wait for 20-40 minutes while the tracer moves through your bloodstream.  Your heart will be monitored with an electrocardiogram (ECG).  You will lie down on an exam table.  Pictures of your heart will be taken for about 15-20 minutes.  You may also have a stress test. For this test, one of these things may be done: ? You will be asked to exercise on a treadmill or a stationary bike. ? You will be given medicines that will make your heart work harder. This is done if you are unable to exercise.  When blood flow to your heart has peaked, a tracer will again be given through the IV tube.  After 20-40 minutes, you will get back on the exam table. More pictures will be taken of your heart.  Depending on the tracer that is used, more pictures may need to be taken 3-4 hours later.  Your IV tube will be removed when the test is over. The test may vary among doctors and hospitals. What happens after the test?  Ask your doctor: ? Whether you can return to your normal schedule, including diet, activities, and medicines. ? Whether you should drink more fluids. This will help to remove the tracer from your body. Drink enough fluid to keep your pee (urine) pale yellow.  Ask your doctor, or the department that is doing the test: ? When will my results be ready? ? How will I get my results? Summary  A cardiac nuclear scan is a test that is done to check the flow of blood to your heart.  Tell your doctor whether you are pregnant or may be pregnant.  Before the test, ask your doctor about changing or stopping your normal medicines. This is important.  Ask your doctor whether you can return to your normal activities. You may be asked to drink more fluids. This information is not intended to replace advice given to you by your health care provider. Make sure you discuss any questions you have with your health care provider. Document Revised: 09/14/2018 Document Reviewed:  11/08/2017 Elsevier Patient Education  2020 [employer].

## 2020-01-31 ENCOUNTER — Encounter: Payer: Self-pay | Admitting: Cardiology

## 2020-01-31 ENCOUNTER — Telehealth: Payer: Self-pay | Admitting: Cardiology

## 2020-01-31 NOTE — Telephone Encounter (Signed)
Left message for patient to call and schedule Lexiscan and 3 Month Follow up with Dr. Antoine Poche

## 2020-02-06 NOTE — Addendum Note (Signed)
Addended by: Chana Bode on: 02/06/2020 10:49 AM   Modules accepted: Orders

## 2020-02-15 ENCOUNTER — Telehealth (HOSPITAL_COMMUNITY): Payer: Self-pay

## 2020-02-15 NOTE — Telephone Encounter (Signed)
Encounter complete. 

## 2020-02-16 ENCOUNTER — Telehealth (HOSPITAL_COMMUNITY): Payer: Self-pay

## 2020-02-16 NOTE — Telephone Encounter (Signed)
Encounter complete. 

## 2020-02-20 ENCOUNTER — Ambulatory Visit (HOSPITAL_COMMUNITY)
Admission: RE | Admit: 2020-02-20 | Payer: Medicaid Other | Source: Ambulatory Visit | Attending: Cardiology | Admitting: Cardiology

## 2020-02-23 ENCOUNTER — Encounter (HOSPITAL_COMMUNITY): Payer: Self-pay | Admitting: Cardiology

## 2020-03-04 ENCOUNTER — Telehealth (HOSPITAL_COMMUNITY): Payer: Self-pay | Admitting: Cardiology

## 2020-03-04 NOTE — Telephone Encounter (Signed)
Just an FYI. We have made several attempts to contact this patient including sending a letter to schedule or reschedule their Myoview. We will be removing the patient from the echo/Nuc WQ.   02/23/2020 MAILED LETTER [facility]  02/20/20 PT NO SHOWED  02/01/20 [facility] to schedule @ 1:28/[facility]      Thank you

## 2020-03-15 ENCOUNTER — Emergency Department (HOSPITAL_COMMUNITY): Payer: Medicaid Other

## 2020-03-15 ENCOUNTER — Other Ambulatory Visit: Payer: Self-pay

## 2020-03-15 ENCOUNTER — Telehealth: Payer: Self-pay | Admitting: Cardiology

## 2020-03-15 ENCOUNTER — Encounter (HOSPITAL_COMMUNITY): Payer: Self-pay | Admitting: Emergency Medicine

## 2020-03-15 ENCOUNTER — Emergency Department (HOSPITAL_COMMUNITY)
Admission: EM | Admit: 2020-03-15 | Discharge: 2020-03-16 | Disposition: A | Discharge status: Home / Self Care | Payer: Medicaid Other | Attending: Emergency Medicine | Admitting: Emergency Medicine

## 2020-03-15 DIAGNOSIS — Z7982 Long term (current) use of aspirin: Secondary | ICD-10-CM | POA: Insufficient documentation

## 2020-03-15 DIAGNOSIS — Z20822 Contact with and (suspected) exposure to covid-19: Secondary | ICD-10-CM | POA: Insufficient documentation

## 2020-03-15 DIAGNOSIS — Z79899 Other long term (current) drug therapy: Secondary | ICD-10-CM | POA: Diagnosis not present

## 2020-03-15 DIAGNOSIS — T402X1A Poisoning by other opioids, accidental (unintentional), initial encounter: Secondary | ICD-10-CM | POA: Insufficient documentation

## 2020-03-15 DIAGNOSIS — T50904A Poisoning by unspecified drugs, medicaments and biological substances, undetermined, initial encounter: Secondary | ICD-10-CM

## 2020-03-15 DIAGNOSIS — F17210 Nicotine dependence, cigarettes, uncomplicated: Secondary | ICD-10-CM | POA: Diagnosis not present

## 2020-03-15 DIAGNOSIS — R4182 Altered mental status, unspecified: Secondary | ICD-10-CM | POA: Diagnosis not present

## 2020-03-15 DIAGNOSIS — J449 Chronic obstructive pulmonary disease, unspecified: Secondary | ICD-10-CM | POA: Diagnosis not present

## 2020-03-15 DIAGNOSIS — I10 Essential (primary) hypertension: Secondary | ICD-10-CM | POA: Diagnosis not present

## 2020-03-15 LAB — CBC WITH DIFFERENTIAL/PLATELET
Abs Immature Granulocytes: 0.02 10*3/uL (ref 0.00–0.07)
Basophils Absolute: 0.1 10*3/uL (ref 0.0–0.1)
Basophils Relative: 1 %
Eosinophils Absolute: 0.4 10*3/uL (ref 0.0–0.5)
Eosinophils Relative: 3 %
HCT: 42.3 % (ref 39.0–52.0)
Hemoglobin: 13.2 g/dL (ref 13.0–17.0)
Immature Granulocytes: 0 %
Lymphocytes Relative: 24 %
Lymphs Abs: 2.5 10*3/uL (ref 0.7–4.0)
MCH: 31.2 pg (ref 26.0–34.0)
MCHC: 31.2 g/dL (ref 30.0–36.0)
MCV: 100 fL (ref 80.0–100.0)
Monocytes Absolute: 0.6 10*3/uL (ref 0.1–1.0)
Monocytes Relative: 6 %
Neutro Abs: 6.8 10*3/uL (ref 1.7–7.7)
Neutrophils Relative %: 66 %
Platelets: 365 10*3/uL (ref 150–400)
RBC: 4.23 MIL/uL (ref 4.22–5.81)
RDW: 14.5 % (ref 11.5–15.5)
WBC: 10.3 10*3/uL (ref 4.0–10.5)
nRBC: 0 % (ref 0.0–0.2)

## 2020-03-15 LAB — RAPID URINE DRUG SCREEN, HOSP PERFORMED
Amphetamines: NOT DETECTED
Barbiturates: NOT DETECTED
Benzodiazepines: POSITIVE — AB
Cocaine: NOT DETECTED
Opiates: NOT DETECTED
Tetrahydrocannabinol: NOT DETECTED

## 2020-03-15 LAB — COMPREHENSIVE METABOLIC PANEL
ALT: 26 U/L (ref 0–44)
AST: 26 U/L (ref 15–41)
Albumin: 3.8 g/dL (ref 3.5–5.0)
Alkaline Phosphatase: 112 U/L (ref 38–126)
Anion gap: 13 (ref 5–15)
BUN: 11 mg/dL (ref 6–20)
CO2: 28 mmol/L (ref 22–32)
Calcium: 10.1 mg/dL (ref 8.9–10.3)
Chloride: 100 mmol/L (ref 98–111)
Creatinine, Ser: 1.06 mg/dL (ref 0.61–1.24)
GFR, Estimated: >60 mL/min (ref >60)
Glucose, Bld: 75 mg/dL (ref 70–99)
Potassium: 4.3 mmol/L (ref 3.5–5.1)
Sodium: 141 mmol/L (ref 135–145)
Total Bilirubin: 0.6 mg/dL (ref 0.3–1.2)
Total Protein: 7.9 g/dL (ref 6.5–8.1)

## 2020-03-15 LAB — RESPIRATORY PANEL BY RT PCR (FLU A&B, COVID)
Influenza A by PCR: NEGATIVE
Influenza B by PCR: NEGATIVE
SARS Coronavirus 2 by RT PCR: NEGATIVE

## 2020-03-15 LAB — CBG MONITORING, ED
Glucose-Capillary: 98 mg/dL (ref 70–99)
Glucose-Capillary: 84 mg/dL (ref 70–99)

## 2020-03-15 MED ORDER — SODIUM CHLORIDE 0.9 % IV BOLUS
1000.0000 mL | Freq: Once | INTRAVENOUS | Status: AC
  Administered 2020-03-15: 1000 mL via INTRAVENOUS

## 2020-03-15 MED ORDER — SODIUM CHLORIDE 0.9 % IV SOLN: INTRAVENOUS | Status: DC

## 2020-03-15 MED ORDER — NALOXONE HCL 0.4 MG/ML IJ SOLN
INTRAMUSCULAR | Status: AC
  Filled 2020-03-15: qty 1
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
## 2020-03-15 NOTE — Telephone Encounter (Signed)
    Pt's mother called, she said pt is in [facility] ED. She said pt needs to go to rehab for post covid pt in [place], they would like for Dr. Antoine Poche call in the hospital to order it

## 2020-03-15 NOTE — ED Notes (Signed)
Pt attempting to walk, staff standing by for safety. Pt placed in recliner waiting for PTAR.

## 2020-03-15 NOTE — ED Notes (Signed)
Pt refusing to goto any SNF or rehab facility

## 2020-03-15 NOTE — ED Notes (Addendum)
Spoke with pt mother and provided update. Mother states he can not come to live with her d/t her being 50 yrs old and not being able to care for his current condition. If and when pt falls, mother is unable to get him up. Per mother son is very "hateful" toward her. Pt does not have any home to return to. Per mother, pt girlfriend house is also not an option.

## 2020-03-15 NOTE — Discharge Instructions (Addendum)
At this time, no complaints or concerns. You may be discharged to follow up as scheduled today for your 3PM appointment.

## 2020-03-15 NOTE — ED Notes (Signed)
Attempted to call pt mother. No answer and VM is full

## 2020-03-15 NOTE — ED Notes (Signed)
Pt returned from CT. RN accompanied pt

## 2020-03-15 NOTE — ED Notes (Signed)
Pt took unknown amount and unknown medication.

## 2020-03-15 NOTE — ED Notes (Signed)
Bladder scan found 743 mL

## 2020-03-15 NOTE — Telephone Encounter (Signed)
Attempted to call  pt's mother re  Message and  mailbox is full unable to leave message. Will try later .[name]

## 2020-03-15 NOTE — ED Notes (Signed)
Pt resting comfortably in the bed. Pt's vital signs are within normal limits. Pt had home medications at bedside. This RN counted meds and sent them to the pharmacy for proper storage until pt is discharged or transferred.

## 2020-03-15 NOTE — ED Notes (Signed)
mother for patient in room 17 states pt swallowed handful of pills "clonazepam" and that he has not had foopd since 2pm yesterday, that he also suicidal 2 months ago, and that to let him know, his mother will see him when he gets better.

## 2020-03-15 NOTE — Discharge Planning (Signed)
RNCM consulted regarding pt's (up for discharge) disposition.  Pt's mom called regarding having pt placed in facility, but pt is not agreeable to placement.  RNCM advised that if pt is not agreeable, we can not make him go and if he is medically cleared, we have to discharge him home.

## 2020-03-15 NOTE — ED Triage Notes (Addendum)
Pt to triage via [facility] from home.  Reports his legs gave out while going to bathroom this morning and he fell.  Denies any injury from fall.  Pt asking EMS to take him back home upon arrival to triage.  Pt states he has a dr's appt in [place] at 9:15am and needs to leave.  [facility] informed pt it is 10am already. Pt is alert and oriented at this time.

## 2020-03-15 NOTE — ED Notes (Signed)
Dinner tray ordered.

## 2020-03-15 NOTE — ED Provider Notes (Signed)
Briefly, this is a patient who needs walker and home assistance. Left rehab early after a month long hospital stay for COVID PNA, now living with mom, but she cannot take care of him. In the ED, he took a handful of pills out of his pocket (protonix, effexor, ?seroquel) and is now somnolent and borderline hypotensive. S/p 2L IVF, 2L [place]. Head CT negative, labs and urine pending.   Patient received 2 L of fluid and still has no urine output from his condom cath.  Patient bladder scanned with approximately 700 cc.  He is refusing to pee and asking for food.  Blood work unremarkable.   Eventually provided urine, UDS positive for benzodiazepines.   Per his mother, he took another handful of pills earlier this morning before presentation to the emergency department.  He would not tell her what those pills were.  Since patient is taking large quantities of unknown medications, will have him evaluated by psychiatry to ensure no intention of self-harm.    Allayne Butcher, MD 03/15/20 2345    Melene Plan, DO 03/15/20 2351

## 2020-03-15 NOTE — ED Notes (Signed)
Pt placed on 2 liters [place]

## 2020-03-16 ENCOUNTER — Telehealth: Payer: Self-pay

## 2020-03-16 ENCOUNTER — Encounter (HOSPITAL_COMMUNITY): Payer: Self-pay

## 2020-03-16 ENCOUNTER — Emergency Department (HOSPITAL_COMMUNITY)
Admission: EM | Admit: 2020-03-16 | Discharge: 2020-03-16 | Disposition: A | Discharge status: Home / Self Care | Payer: Medicaid Other | Source: Home / Self Care | Attending: Emergency Medicine | Admitting: Emergency Medicine

## 2020-03-16 DIAGNOSIS — J449 Chronic obstructive pulmonary disease, unspecified: Secondary | ICD-10-CM | POA: Insufficient documentation

## 2020-03-16 DIAGNOSIS — Z7982 Long term (current) use of aspirin: Secondary | ICD-10-CM | POA: Insufficient documentation

## 2020-03-16 DIAGNOSIS — Z79899 Other long term (current) drug therapy: Secondary | ICD-10-CM | POA: Insufficient documentation

## 2020-03-16 DIAGNOSIS — M5442 Lumbago with sciatica, left side: Secondary | ICD-10-CM | POA: Insufficient documentation

## 2020-03-16 DIAGNOSIS — F17210 Nicotine dependence, cigarettes, uncomplicated: Secondary | ICD-10-CM | POA: Insufficient documentation

## 2020-03-16 DIAGNOSIS — G8929 Other chronic pain: Secondary | ICD-10-CM | POA: Insufficient documentation

## 2020-03-16 DIAGNOSIS — I10 Essential (primary) hypertension: Secondary | ICD-10-CM | POA: Insufficient documentation

## 2020-03-16 MED ORDER — GABAPENTIN 300 MG PO CAPS
800.0000 mg | ORAL_CAPSULE | Freq: Once | ORAL | Status: AC
  Administered 2020-03-16: 800 mg via ORAL
  Filled 2020-03-16: qty 2
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
## 2020-03-16 NOTE — Discharge Instructions (Addendum)
Continue your home medications. Return to the ER if you start to experience worsening pain, injuries or falls, fever, chest pain or shortness of breath.

## 2020-03-16 NOTE — Telephone Encounter (Signed)
Surveyed the chart as CSW received call stating that Jonathan Mcbride medications did not discharge with him. Found a note stating that medications were counted and sent to pharmacy. Called pharmacy and spoke to Jonathan Mcbride. She called back and found the medications. There were 4 prescriptions in containers from [employer]. I called the house phone, and Jonathan Mcbride answered, they will be on their way from [place] to pick them up. They can pick them up at the front desk.

## 2020-03-16 NOTE — ED Triage Notes (Signed)
Pt was just discharged, never left and feels he needs more help with his back pain and hip pain

## 2020-03-16 NOTE — ED Notes (Signed)
Pt called this RN to the room. Pt taking all his leads off stating he was leaving. This RN informed Dr. Clayborne Dana who stated to ask pt to wait to talk to him before leaving but if he makes a scene to let him leave.
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
## 2020-03-18 NOTE — Telephone Encounter (Signed)
Spoke with pt's mother and will call back if needs anything Per mom pt is doing Physical Therapy at this time and has other appts to go to Disregard this message as circumstance have changed .[name]

## 2020-03-23 ENCOUNTER — Other Ambulatory Visit: Payer: Self-pay

## 2020-03-23 ENCOUNTER — Emergency Department (HOSPITAL_BASED_OUTPATIENT_CLINIC_OR_DEPARTMENT_OTHER)
Admission: EM | Admit: 2020-03-23 | Discharge: 2020-03-23 | Disposition: A | Discharge status: Home / Self Care | Payer: Medicaid Other | Attending: Emergency Medicine | Admitting: Emergency Medicine

## 2020-03-23 ENCOUNTER — Encounter (HOSPITAL_BASED_OUTPATIENT_CLINIC_OR_DEPARTMENT_OTHER): Payer: Self-pay | Admitting: Emergency Medicine

## 2020-03-23 ENCOUNTER — Emergency Department (HOSPITAL_BASED_OUTPATIENT_CLINIC_OR_DEPARTMENT_OTHER): Payer: Medicaid Other

## 2020-03-23 DIAGNOSIS — Z7982 Long term (current) use of aspirin: Secondary | ICD-10-CM | POA: Insufficient documentation

## 2020-03-23 DIAGNOSIS — F17210 Nicotine dependence, cigarettes, uncomplicated: Secondary | ICD-10-CM | POA: Insufficient documentation

## 2020-03-23 DIAGNOSIS — J449 Chronic obstructive pulmonary disease, unspecified: Secondary | ICD-10-CM | POA: Diagnosis not present

## 2020-03-23 DIAGNOSIS — I10 Essential (primary) hypertension: Secondary | ICD-10-CM | POA: Insufficient documentation

## 2020-03-23 DIAGNOSIS — Z79899 Other long term (current) drug therapy: Secondary | ICD-10-CM | POA: Insufficient documentation

## 2020-03-23 DIAGNOSIS — R0781 Pleurodynia: Secondary | ICD-10-CM | POA: Diagnosis present

## 2020-03-23 DIAGNOSIS — J181 Lobar pneumonia, unspecified organism: Secondary | ICD-10-CM | POA: Insufficient documentation

## 2020-03-23 DIAGNOSIS — W19XXXA Unspecified fall, initial encounter: Secondary | ICD-10-CM

## 2020-03-23 DIAGNOSIS — J189 Pneumonia, unspecified organism: Secondary | ICD-10-CM

## 2020-03-23 MED ORDER — AMOXICILLIN-POT CLAVULANATE 500-125 MG PO TABS: 2000.0000 mg | ORAL_TABLET | Freq: Two times a day (BID) | ORAL | 0 refills | Status: DC

## 2020-03-23 MED ORDER — LEVOFLOXACIN 750 MG PO TABS: 750.0000 mg | ORAL_TABLET | Freq: Every day | ORAL | 0 refills | Status: DC

## 2020-03-23 MED ORDER — AZITHROMYCIN 250 MG PO TABS: 250.0000 mg | ORAL_TABLET | Freq: Every day | ORAL | 0 refills | Status: DC

## 2020-03-23 MED ORDER — AMOXICILLIN-POT CLAVULANATE 875-125 MG PO TABS: 1.0000 | ORAL_TABLET | Freq: Two times a day (BID) | ORAL | 0 refills | Status: AC

## 2020-03-23 MED ORDER — AZITHROMYCIN 250 MG PO TABS
500.0000 mg | ORAL_TABLET | Freq: Once | ORAL | Status: AC
  Administered 2020-03-23: 500 mg via ORAL
  Filled 2020-03-23: qty 2

## 2020-03-23 MED ORDER — AZITHROMYCIN 250 MG PO TABS: 250.0000 mg | ORAL_TABLET | Freq: Every day | ORAL | 0 refills | Status: AC
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
## 2020-03-23 NOTE — Discharge Instructions (Addendum)
Your x-ray shows that you have pneumonia.  I placed you on antibiotics please take as prescribed.    Please follow-up with your PCP for further evaluation management.  Come back to the emergency department if you develop chest pain, shortness of breath, severe abdominal pain, uncontrolled nausea, vomiting and diarrhea.
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
## 2020-03-23 NOTE — ED Triage Notes (Signed)
Reports he was discharged October 1st after having covid.  Previously had cpr performed and was on a ventilator.  Reports he had cracked ribs from that and fell again last night hitting the right side again.  Pretty sure he has re-injured those ribs.

## 2020-03-23 NOTE — ED Notes (Signed)
Mechanical fall yesterday while walking too fast down hill on a sidewalk. States he has had difficulty ambulating since having COVID. Ibuprofen not effective. Speaking in full sentences without difficulty or shortness of breath. Pain with deep inspiration.   Verified pharmacy - wants meds to go to [facility] even though they are not open.

## 2020-04-10 DIAGNOSIS — I4510 Unspecified right bundle-branch block: Secondary | ICD-10-CM | POA: Insufficient documentation

## 2020-04-10 DIAGNOSIS — R0602 Shortness of breath: Secondary | ICD-10-CM | POA: Insufficient documentation
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
## 2020-04-11 ENCOUNTER — Telehealth: Payer: Self-pay

## 2020-04-11 ENCOUNTER — Ambulatory Visit: Payer: Medicaid Other | Admitting: Cardiology

## 2020-04-11 DIAGNOSIS — R072 Precordial pain: Secondary | ICD-10-CM

## 2020-04-11 DIAGNOSIS — I4510 Unspecified right bundle-branch block: Secondary | ICD-10-CM

## 2020-04-11 DIAGNOSIS — R0602 Shortness of breath: Secondary | ICD-10-CM

## 2020-04-11 NOTE — Telephone Encounter (Signed)
Called to see if pt would be able to attend his appointment with Dr. Antoine Poche today. Spoke with pt's mother Tanna Furry (okay per DPR), who said that pt is currently admitted to [facility] and would call to reschedule his appointment at his earliest opportunity.

## 2020-05-12 DIAGNOSIS — Z7189 Other specified counseling: Secondary | ICD-10-CM | POA: Insufficient documentation
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
## 2020-05-13 ENCOUNTER — Ambulatory Visit: Payer: Medicaid Other | Admitting: Cardiology

## 2020-05-13 DIAGNOSIS — R072 Precordial pain: Secondary | ICD-10-CM

## 2020-05-13 DIAGNOSIS — I4510 Unspecified right bundle-branch block: Secondary | ICD-10-CM

## 2020-05-13 DIAGNOSIS — Z7189 Other specified counseling: Secondary | ICD-10-CM

## 2020-05-17 ENCOUNTER — Encounter: Payer: Self-pay | Admitting: Cardiology
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
## 2020-06-11 ENCOUNTER — Ambulatory Visit: Payer: Medicaid Other | Admitting: Cardiology

## 2020-06-11 DIAGNOSIS — R072 Precordial pain: Secondary | ICD-10-CM

## 2020-06-11 DIAGNOSIS — I4510 Unspecified right bundle-branch block: Secondary | ICD-10-CM

## 2020-06-11 DIAGNOSIS — R0602 Shortness of breath: Secondary | ICD-10-CM
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
## 2020-07-05 ENCOUNTER — Telehealth: Payer: Self-pay

## 2020-07-05 ENCOUNTER — Ambulatory Visit: Payer: Medicaid Other | Admitting: Cardiology

## 2020-07-05 NOTE — Telephone Encounter (Signed)
Spoke with patient, patient unaware appointment was today. Stated "sorry I missed my appointment yesterday I was out of town." Patient will call office back to reschedule however he may not reschedule with Dr. Antoine Poche at this time. Message sent to scheduling.

## 2020-09-11 ENCOUNTER — Other Ambulatory Visit: Payer: Self-pay

## 2020-09-11 ENCOUNTER — Emergency Department (HOSPITAL_COMMUNITY): Payer: Medicaid Other

## 2020-09-11 ENCOUNTER — Emergency Department (HOSPITAL_COMMUNITY)
Admission: EM | Admit: 2020-09-11 | Discharge: 2020-09-13 | Disposition: A | Discharge status: Home / Self Care | Payer: Medicaid Other | Attending: Emergency Medicine | Admitting: Emergency Medicine

## 2020-09-11 DIAGNOSIS — I10 Essential (primary) hypertension: Secondary | ICD-10-CM | POA: Diagnosis not present

## 2020-09-11 DIAGNOSIS — Z7951 Long term (current) use of inhaled steroids: Secondary | ICD-10-CM | POA: Insufficient documentation

## 2020-09-11 DIAGNOSIS — Z7982 Long term (current) use of aspirin: Secondary | ICD-10-CM | POA: Diagnosis not present

## 2020-09-11 DIAGNOSIS — F17210 Nicotine dependence, cigarettes, uncomplicated: Secondary | ICD-10-CM | POA: Insufficient documentation

## 2020-09-11 DIAGNOSIS — Z79899 Other long term (current) drug therapy: Secondary | ICD-10-CM | POA: Insufficient documentation

## 2020-09-11 DIAGNOSIS — R45851 Suicidal ideations: Secondary | ICD-10-CM

## 2020-09-11 DIAGNOSIS — F1020 Alcohol dependence, uncomplicated: Secondary | ICD-10-CM | POA: Insufficient documentation

## 2020-09-11 DIAGNOSIS — J449 Chronic obstructive pulmonary disease, unspecified: Secondary | ICD-10-CM | POA: Insufficient documentation

## 2020-09-11 DIAGNOSIS — F332 Major depressive disorder, recurrent severe without psychotic features: Secondary | ICD-10-CM | POA: Insufficient documentation

## 2020-09-11 DIAGNOSIS — Z20822 Contact with and (suspected) exposure to covid-19: Secondary | ICD-10-CM | POA: Insufficient documentation

## 2020-09-11 DIAGNOSIS — R002 Palpitations: Secondary | ICD-10-CM | POA: Insufficient documentation

## 2020-09-11 DIAGNOSIS — R079 Chest pain, unspecified: Secondary | ICD-10-CM

## 2020-09-11 DIAGNOSIS — R0789 Other chest pain: Secondary | ICD-10-CM | POA: Diagnosis not present

## 2020-09-11 LAB — COMPREHENSIVE METABOLIC PANEL
ALT: 22 U/L (ref 0–44)
AST: 26 U/L (ref 15–41)
Albumin: 3.9 g/dL (ref 3.5–5.0)
Alkaline Phosphatase: 89 U/L (ref 38–126)
Anion gap: 9 (ref 5–15)
BUN: 14 mg/dL (ref 6–20)
CO2: 27 mmol/L (ref 22–32)
Calcium: 9.2 mg/dL (ref 8.9–10.3)
Chloride: 105 mmol/L (ref 98–111)
Creatinine, Ser: 0.84 mg/dL (ref 0.61–1.24)
GFR, Estimated: >60 mL/min (ref >60)
Glucose, Bld: 88 mg/dL (ref 70–99)
Potassium: 3.7 mmol/L (ref 3.5–5.1)
Sodium: 141 mmol/L (ref 135–145)
Total Bilirubin: 0.5 mg/dL (ref 0.3–1.2)
Total Protein: 6.7 g/dL (ref 6.5–8.1)

## 2020-09-11 LAB — CBC
HCT: 42.2 % (ref 39.0–52.0)
Hemoglobin: 14.1 g/dL (ref 13.0–17.0)
MCH: 33.1 pg (ref 26.0–34.0)
MCHC: 33.4 g/dL (ref 30.0–36.0)
MCV: 99.1 fL (ref 80.0–100.0)
Platelets: 287 10*3/uL (ref 150–400)
RBC: 4.26 MIL/uL (ref 4.22–5.81)
RDW: 13.3 % (ref 11.5–15.5)
WBC: 8.1 10*3/uL (ref 4.0–10.5)
nRBC: 0.2 % (ref 0.0–0.2)

## 2020-09-11 LAB — RESP PANEL BY RT-PCR (FLU A&B, COVID) ARPGX2
Influenza A by PCR: NEGATIVE
Influenza B by PCR: NEGATIVE
SARS Coronavirus 2 by RT PCR: NEGATIVE

## 2020-09-11 LAB — ETHANOL: Alcohol, Ethyl (B): <10 mg/dL (ref <10)

## 2020-09-11 LAB — TROPONIN I (HIGH SENSITIVITY)
Troponin I (High Sensitivity): 7 ng/L (ref <18)
Troponin I (High Sensitivity): 6 ng/L (ref <18)

## 2020-09-11 MED ORDER — QUETIAPINE FUMARATE 200 MG PO TABS
400.0000 mg | ORAL_TABLET | Freq: Every day | ORAL | Status: DC
  Administered 2020-09-12: 400 mg via ORAL
  Filled 2020-09-11: qty 2
  Filled 2020-09-11: qty 1

## 2020-09-11 MED ORDER — ALBUTEROL SULFATE HFA 108 (90 BASE) MCG/ACT IN AERS: 1.0000 | INHALATION_SPRAY | Freq: Four times a day (QID) | RESPIRATORY_TRACT | Status: DC | PRN

## 2020-09-11 MED ORDER — UMECLIDINIUM BROMIDE 62.5 MCG/INH IN AEPB
1.0000 | INHALATION_SPRAY | Freq: Every day | RESPIRATORY_TRACT | Status: DC
  Administered 2020-09-13: 1 via RESPIRATORY_TRACT
  Filled 2020-09-11: qty 7

## 2020-09-11 MED ORDER — GABAPENTIN 300 MG PO CAPS
600.0000 mg | ORAL_CAPSULE | Freq: Three times a day (TID) | ORAL | Status: DC
  Administered 2020-09-11 – 2020-09-12 (×2): 600 mg via ORAL
  Filled 2020-09-11 (×2): qty 2

## 2020-09-11 MED ORDER — PANTOPRAZOLE SODIUM 40 MG PO TBEC
40.0000 mg | DELAYED_RELEASE_TABLET | Freq: Every day | ORAL | Status: DC
  Administered 2020-09-11 – 2020-09-12 (×2): 40 mg via ORAL
  Filled 2020-09-11 (×2): qty 1

## 2020-09-11 MED ORDER — ATENOLOL 25 MG PO TABS
50.0000 mg | ORAL_TABLET | Freq: Every day | ORAL | Status: DC
  Administered 2020-09-11 – 2020-09-13 (×3): 50 mg via ORAL
  Filled 2020-09-11: qty 1
  Filled 2020-09-11: qty 2
  Filled 2020-09-11: qty 1

## 2020-09-11 MED ORDER — METHOCARBAMOL 500 MG PO TABS
1500.0000 mg | ORAL_TABLET | Freq: Three times a day (TID) | ORAL | Status: DC
  Administered 2020-09-11 – 2020-09-13 (×5): 1500 mg via ORAL
  Filled 2020-09-11 (×5): qty 3

## 2020-09-11 MED ORDER — VENLAFAXINE HCL ER 75 MG PO CP24
75.0000 mg | ORAL_CAPSULE | Freq: Every day | ORAL | Status: DC
  Administered 2020-09-12 – 2020-09-13 (×2): 75 mg via ORAL
  Filled 2020-09-11 (×2): qty 1

## 2020-09-11 MED ORDER — ATENOLOL 25 MG PO TABS: 25.0000 mg | ORAL_TABLET | Freq: Two times a day (BID) | ORAL | Status: DC

## 2020-09-11 MED ORDER — FUROSEMIDE 20 MG PO TABS
40.0000 mg | ORAL_TABLET | Freq: Every day | ORAL | Status: DC
  Administered 2020-09-11 – 2020-09-13 (×3): 40 mg via ORAL
  Filled 2020-09-11 (×3): qty 2

## 2020-09-11 MED ORDER — LAMOTRIGINE 100 MG PO TABS
100.0000 mg | ORAL_TABLET | Freq: Every day | ORAL | Status: DC
  Administered 2020-09-12 – 2020-09-13 (×2): 100 mg via ORAL
  Filled 2020-09-11 (×3): qty 1

## 2020-09-11 NOTE — ED Provider Notes (Signed)
Patient placed in Quick Look pathway, seen and evaluated   Chief Complaint: chest pain and suicidal   HPI:   Pt complains of chest pain.  Pt reports he is trying to detox.  Pt reports he is having suicidal thoughts.  Pt reports he thought about hanging himself off a bridge.  ROS: muscle aches  Physical Exam:   Gen: No distress  Neuro: Awake and Alert  Skin: Warm    Focused Exam: Lungs clear Heart RRR   Initiation of care has begun. The patient has been counseled on the process, plan, and necessity for staying for the completion/evaluation, and the remainder of the medical screening examination    Jonathan Mcbride 09/11/20 1842    Gwyneth Sprout, MD 09/11/20 2303

## 2020-09-11 NOTE — ED Notes (Signed)
PA in triage.
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
## 2020-09-11 NOTE — ED Triage Notes (Signed)
Pt states he is trying to stop drinking and for the last 5 days has been drinking lots. Pt also states some heart fluttering. Pt states that he is suicidal, has a plan and will act on it. Pt states getting up on a bridge to jump PTA.  Pt states episodes of dizziness and episodes of blacking out.

## 2020-09-12 LAB — RAPID URINE DRUG SCREEN, HOSP PERFORMED
Amphetamines: NOT DETECTED
Barbiturates: NOT DETECTED
Benzodiazepines: NOT DETECTED
Cocaine: POSITIVE — AB
Opiates: NOT DETECTED
Tetrahydrocannabinol: POSITIVE — AB

## 2020-09-12 MED ORDER — OXYCODONE-ACETAMINOPHEN 10-325 MG PO TABS: 1.0000 | ORAL_TABLET | Freq: Three times a day (TID) | ORAL | Status: DC | PRN

## 2020-09-12 MED ORDER — ACETAMINOPHEN 500 MG PO TABS: 1000.0000 mg | ORAL_TABLET | Freq: Once | ORAL | Status: DC

## 2020-09-12 MED ORDER — CELECOXIB 200 MG PO CAPS
200.0000 mg | ORAL_CAPSULE | Freq: Every day | ORAL | Status: DC
  Filled 2020-09-12: qty 1

## 2020-09-12 MED ORDER — ACETAMINOPHEN 500 MG PO TABS
1000.0000 mg | ORAL_TABLET | Freq: Four times a day (QID) | ORAL | Status: DC | PRN
  Administered 2020-09-12 (×2): 1000 mg via ORAL
  Filled 2020-09-12 (×2): qty 2

## 2020-09-12 MED ORDER — CLONAZEPAM 0.5 MG PO TABS
1.0000 mg | ORAL_TABLET | Freq: Two times a day (BID) | ORAL | Status: DC
  Administered 2020-09-12 – 2020-09-13 (×3): 1 mg via ORAL
  Filled 2020-09-12 (×3): qty 2

## 2020-09-12 MED ORDER — PANTOPRAZOLE SODIUM 20 MG PO TBEC
20.0000 mg | DELAYED_RELEASE_TABLET | Freq: Every day | ORAL | Status: DC
  Administered 2020-09-13: 20 mg via ORAL
  Filled 2020-09-12: qty 1

## 2020-09-12 MED ORDER — GABAPENTIN 400 MG PO CAPS
800.0000 mg | ORAL_CAPSULE | Freq: Three times a day (TID) | ORAL | Status: DC
  Administered 2020-09-12 – 2020-09-13 (×3): 800 mg via ORAL
  Filled 2020-09-12 (×3): qty 2

## 2020-09-12 MED ORDER — OXYCODONE-ACETAMINOPHEN 5-325 MG PO TABS
1.0000 | ORAL_TABLET | Freq: Three times a day (TID) | ORAL | Status: DC | PRN
  Administered 2020-09-12 – 2020-09-13 (×3): 1 via ORAL
  Filled 2020-09-12 (×3): qty 1

## 2020-09-12 MED ORDER — CYCLOBENZAPRINE HCL 10 MG PO TABS
10.0000 mg | ORAL_TABLET | Freq: Three times a day (TID) | ORAL | Status: DC | PRN
  Administered 2020-09-13: 10 mg via ORAL
  Filled 2020-09-12 (×2): qty 1

## 2020-09-12 MED ORDER — NICOTINE 7 MG/24HR TD PT24
7.0000 mg | MEDICATED_PATCH | Freq: Once | TRANSDERMAL | Status: AC
  Administered 2020-09-12: 7 mg via TRANSDERMAL
  Filled 2020-09-12: qty 1

## 2020-09-12 MED ORDER — ATORVASTATIN CALCIUM 40 MG PO TABS
40.0000 mg | ORAL_TABLET | Freq: Every day | ORAL | Status: DC
  Administered 2020-09-12: 40 mg via ORAL
  Filled 2020-09-12: qty 1

## 2020-09-12 MED ORDER — VENLAFAXINE HCL ER 75 MG PO CP24: 75.0000 mg | ORAL_CAPSULE | Freq: Every day | ORAL | Status: DC

## 2020-09-12 MED ORDER — APIXABAN 5 MG PO TABS
5.0000 mg | ORAL_TABLET | Freq: Two times a day (BID) | ORAL | Status: DC
  Administered 2020-09-12 – 2020-09-13 (×2): 5 mg via ORAL
  Filled 2020-09-12 (×2): qty 1

## 2020-09-12 MED ORDER — BENZTROPINE MESYLATE 1 MG PO TABS
1.0000 mg | ORAL_TABLET | Freq: Every day | ORAL | Status: DC
  Administered 2020-09-12 – 2020-09-13 (×2): 1 mg via ORAL
  Filled 2020-09-12 (×2): qty 1

## 2020-09-12 MED ORDER — ASPIRIN EC 81 MG PO TBEC
81.0000 mg | DELAYED_RELEASE_TABLET | Freq: Every day | ORAL | Status: DC
  Administered 2020-09-12 – 2020-09-13 (×2): 81 mg via ORAL
  Filled 2020-09-12 (×2): qty 1

## 2020-09-12 MED ORDER — OXYCODONE HCL 5 MG PO TABS
5.0000 mg | ORAL_TABLET | Freq: Three times a day (TID) | ORAL | Status: DC | PRN
  Administered 2020-09-12 – 2020-09-13 (×3): 5 mg via ORAL
  Filled 2020-09-12 (×3): qty 1

## 2020-09-12 NOTE — ED Notes (Signed)
Pt made first phone call of the day. Pt was informed that he gets 2 phone calls a day, 5 min each. Pt understanding and agreeable.

## 2020-09-12 NOTE — ED Notes (Signed)
Dinner tray at bedside
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
## 2020-09-12 NOTE — ED Notes (Signed)
TTS in progress 

## 2020-09-12 NOTE — ED Notes (Signed)
Pt given bible and AA book per pt's request; pt calm, cooperative, and pleasant at this time

## 2020-09-12 NOTE — ED Notes (Signed)
Pt on phone 

## 2020-09-12 NOTE — ED Notes (Signed)
[facility] called. Pt is to go for Inpatient treatment.

## 2020-09-12 NOTE — ED Notes (Signed)
Pt belongings (3 bags) are at nurses station.

## 2020-09-12 NOTE — ED Notes (Signed)
Pt in shower.  

## 2020-09-13 ENCOUNTER — Other Ambulatory Visit: Payer: Self-pay

## 2020-09-13 ENCOUNTER — Inpatient Hospital Stay (HOSPITAL_COMMUNITY)
Admission: AD | Admit: 2020-09-13 | Discharge: 2020-09-17 | DRG: 897 | Disposition: A | Discharge status: Home / Self Care | Payer: Medicaid Other | Source: Intra-hospital | Attending: Psychiatry | Admitting: Psychiatry

## 2020-09-13 ENCOUNTER — Encounter (HOSPITAL_COMMUNITY): Payer: Self-pay | Admitting: Family

## 2020-09-13 DIAGNOSIS — K219 Gastro-esophageal reflux disease without esophagitis: Secondary | ICD-10-CM | POA: Diagnosis present

## 2020-09-13 DIAGNOSIS — I10 Essential (primary) hypertension: Secondary | ICD-10-CM | POA: Diagnosis present

## 2020-09-13 DIAGNOSIS — F17210 Nicotine dependence, cigarettes, uncomplicated: Secondary | ICD-10-CM | POA: Diagnosis present

## 2020-09-13 DIAGNOSIS — F1024 Alcohol dependence with alcohol-induced mood disorder: Secondary | ICD-10-CM | POA: Diagnosis present

## 2020-09-13 DIAGNOSIS — Z5900 Homelessness unspecified: Secondary | ICD-10-CM | POA: Diagnosis not present

## 2020-09-13 DIAGNOSIS — J449 Chronic obstructive pulmonary disease, unspecified: Secondary | ICD-10-CM | POA: Diagnosis present

## 2020-09-13 DIAGNOSIS — G4700 Insomnia, unspecified: Secondary | ICD-10-CM | POA: Diagnosis present

## 2020-09-13 DIAGNOSIS — F1094 Alcohol use, unspecified with alcohol-induced mood disorder: Secondary | ICD-10-CM | POA: Diagnosis present

## 2020-09-13 DIAGNOSIS — E7800 Pure hypercholesterolemia, unspecified: Secondary | ICD-10-CM | POA: Diagnosis present

## 2020-09-13 DIAGNOSIS — R45851 Suicidal ideations: Secondary | ICD-10-CM | POA: Diagnosis present

## 2020-09-13 DIAGNOSIS — Z20822 Contact with and (suspected) exposure to covid-19: Secondary | ICD-10-CM | POA: Diagnosis present

## 2020-09-13 DIAGNOSIS — F332 Major depressive disorder, recurrent severe without psychotic features: Secondary | ICD-10-CM | POA: Diagnosis not present

## 2020-09-13 MED ORDER — QUETIAPINE FUMARATE 400 MG PO TABS
400.0000 mg | ORAL_TABLET | Freq: Every day | ORAL | Status: DC
  Administered 2020-09-13 – 2020-09-16 (×4): 400 mg via ORAL
  Filled 2020-09-13 (×3): qty 1
  Filled 2020-09-13: qty 2
  Filled 2020-09-13 (×2): qty 1

## 2020-09-13 MED ORDER — BENZTROPINE MESYLATE 1 MG PO TABS
1.0000 mg | ORAL_TABLET | Freq: Every day | ORAL | Status: DC
  Filled 2020-09-13: qty 1

## 2020-09-13 MED ORDER — TRAMADOL HCL 50 MG PO TABS
50.0000 mg | ORAL_TABLET | Freq: Four times a day (QID) | ORAL | Status: DC | PRN
  Administered 2020-09-13 – 2020-09-17 (×10): 50 mg via ORAL
  Filled 2020-09-13 (×11): qty 1

## 2020-09-13 MED ORDER — UMECLIDINIUM BROMIDE 62.5 MCG/INH IN AEPB
1.0000 | INHALATION_SPRAY | Freq: Every day | RESPIRATORY_TRACT | Status: DC
  Administered 2020-09-14 – 2020-09-17 (×4): 1 via RESPIRATORY_TRACT
  Filled 2020-09-13 (×2): qty 7

## 2020-09-13 MED ORDER — ASPIRIN EC 81 MG PO TBEC
81.0000 mg | DELAYED_RELEASE_TABLET | Freq: Every day | ORAL | Status: DC
  Administered 2020-09-14 – 2020-09-17 (×4): 81 mg via ORAL
  Filled 2020-09-13 (×5): qty 1

## 2020-09-13 MED ORDER — IBUPROFEN 800 MG PO TABS
800.0000 mg | ORAL_TABLET | Freq: Three times a day (TID) | ORAL | Status: DC | PRN
  Administered 2020-09-13 – 2020-09-17 (×8): 800 mg via ORAL
  Filled 2020-09-13 (×9): qty 1

## 2020-09-13 MED ORDER — ALBUTEROL SULFATE HFA 108 (90 BASE) MCG/ACT IN AERS
1.0000 | INHALATION_SPRAY | Freq: Four times a day (QID) | RESPIRATORY_TRACT | Status: DC | PRN
  Administered 2020-09-15 – 2020-09-17 (×4): 2 via RESPIRATORY_TRACT
  Filled 2020-09-13: qty 6.7

## 2020-09-13 MED ORDER — GABAPENTIN 400 MG PO CAPS
800.0000 mg | ORAL_CAPSULE | Freq: Three times a day (TID) | ORAL | Status: DC
  Administered 2020-09-13 – 2020-09-17 (×11): 800 mg via ORAL
  Filled 2020-09-13 (×14): qty 2

## 2020-09-13 MED ORDER — ATENOLOL 25 MG PO TABS
12.5000 mg | ORAL_TABLET | Freq: Two times a day (BID) | ORAL | Status: DC
  Administered 2020-09-14 – 2020-09-17 (×7): 12.5 mg via ORAL
  Filled 2020-09-13 (×10): qty 1

## 2020-09-13 MED ORDER — APIXABAN 5 MG PO TABS
5.0000 mg | ORAL_TABLET | Freq: Two times a day (BID) | ORAL | Status: DC
  Filled 2020-09-13 (×2): qty 1

## 2020-09-13 MED ORDER — HYDROXYZINE HCL 25 MG PO TABS
25.0000 mg | ORAL_TABLET | Freq: Three times a day (TID) | ORAL | Status: DC | PRN
  Administered 2020-09-13 – 2020-09-17 (×9): 25 mg via ORAL
  Filled 2020-09-13 (×8): qty 1
  Filled 2020-09-13: qty 20
  Filled 2020-09-13: qty 1

## 2020-09-13 MED ORDER — PANTOPRAZOLE SODIUM 20 MG PO TBEC
20.0000 mg | DELAYED_RELEASE_TABLET | Freq: Every day | ORAL | Status: DC
  Filled 2020-09-13: qty 1

## 2020-09-13 MED ORDER — ATORVASTATIN CALCIUM 40 MG PO TABS
40.0000 mg | ORAL_TABLET | Freq: Every day | ORAL | Status: DC
  Administered 2020-09-13 – 2020-09-16 (×4): 40 mg via ORAL
  Filled 2020-09-13 (×6): qty 1

## 2020-09-13 MED ORDER — VENLAFAXINE HCL ER 75 MG PO CP24
75.0000 mg | ORAL_CAPSULE | Freq: Every day | ORAL | Status: DC
  Administered 2020-09-14: 75 mg via ORAL
  Filled 2020-09-13 (×2): qty 1

## 2020-09-13 MED ORDER — MAGNESIUM HYDROXIDE 400 MG/5ML PO SUSP: 30.0000 mL | Freq: Every day | ORAL | Status: DC | PRN

## 2020-09-13 MED ORDER — LORAZEPAM 1 MG PO TABS
1.0000 mg | ORAL_TABLET | Freq: Four times a day (QID) | ORAL | Status: DC | PRN
  Administered 2020-09-13 – 2020-09-14 (×2): 1 mg via ORAL
  Filled 2020-09-13 (×2): qty 1

## 2020-09-13 MED ORDER — PANTOPRAZOLE SODIUM 40 MG PO TBEC
40.0000 mg | DELAYED_RELEASE_TABLET | Freq: Every day | ORAL | Status: DC
  Administered 2020-09-14 – 2020-09-17 (×4): 40 mg via ORAL
  Filled 2020-09-13 (×5): qty 1

## 2020-09-13 MED ORDER — NICOTINE 7 MG/24HR TD PT24
7.0000 mg | MEDICATED_PATCH | Freq: Once | TRANSDERMAL | Status: DC
  Administered 2020-09-13: 7 mg via TRANSDERMAL
  Filled 2020-09-13: qty 1

## 2020-09-13 MED ORDER — FUROSEMIDE 40 MG PO TABS
40.0000 mg | ORAL_TABLET | Freq: Every day | ORAL | Status: DC
  Administered 2020-09-14 – 2020-09-16 (×3): 40 mg via ORAL
  Filled 2020-09-13 (×5): qty 1

## 2020-09-13 MED ORDER — ACETAMINOPHEN 325 MG PO TABS
650.0000 mg | ORAL_TABLET | Freq: Four times a day (QID) | ORAL | Status: DC | PRN
  Administered 2020-09-13 – 2020-09-16 (×5): 650 mg via ORAL
  Filled 2020-09-13 (×6): qty 2

## 2020-09-13 MED ORDER — ALUM & MAG HYDROXIDE-SIMETH 200-200-20 MG/5ML PO SUSP: 30.0000 mL | ORAL | Status: DC | PRN
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
## 2020-09-13 NOTE — Progress Notes (Signed)
Patient did not attend the evening speaker AA meeting.  

## 2020-09-13 NOTE — ED Notes (Signed)
Patient denies pain and is resting comfortably.  

## 2020-09-13 NOTE — [facility] Suicide Risk Assessment (Signed)
[facility] Admission Suicide Risk Assessment   Nursing information obtained from:    Demographic factors:    Current Mental Status:    Loss Factors:    Historical Factors:    Risk Reduction Factors:     Total Time spent with patient: 30 minutes Principal Problem: <principal problem not specified> Diagnosis:  Active Problems:   Alcohol use with alcohol-induced mood disorder (HCC)  Subjective Data: Patient is seen and examined.  Patient is a 50 year old male with a past psychiatric history significant for primarily alcohol dependence but also benzodiazepine dependence and probable opiate dependence who originally presented to the [facility] emergency department on 09/11/2020 stating he is trying to stop drinking and for the last 5 days had been drinking a large amount.  He stated that his heart was fluttering, and that he was suicidal.  He stated he had a plan and he would act on it.  His plan was to get up on a bridge and jump off.  He also complained of episodes of dizziness and blacking out.  He also stated that he was in the emergency department voluntarily, and had no interest in leaving the hospital because he would like help.  He was seen by the comprehensive clinical assessment team on 09/12/2020.  He stated that he had drank away his 26-year marriage and that his life was unmanageable.  At that time he denied suicidal ideation, homicidal ideation, auditory or visual hallucinations as well as self-injurious behaviors or access to weapons.  He stated his last drink of alcohol was on 09/09/2020 at 2 AM.  He also reported using a gram of cocaine 3 to 4 days prior to admission.  He also smoked a joint 4 to 5 days ago.  His drug screen at that time was positive for cocaine and marijuana.  He stated that his primary care provider prescribes his medications, but had not taken them in 3 to 4 weeks.  He denied any previous inpatient hospitalizations.  It was decided to admit the patient to the  hospital for evaluation and stabilization.  On evaluation at the behavioral health hospital the patient stated that he had never been in rehabilitation, or hospitalization.  Review of the electronic medical record revealed that he had been at [facility] presenting to the emergency department at that time on 09/10/2020.  He was given multiple outpatient resources at that time.  His complaint at that emergency room visit was of dizziness.  His urine drug screen from the [facility] on 09/10/2020 where he had been earlier that day indicated a drug screen was positive for benzodiazepines, cocaine, OxyContin, THC, methadone and tricyclic antidepressants.  The patient reported to the emergency room physician there that he had gone to the [facility] but that they were closed.  The emergency room physician stated that their facility is open 24/7.  When we discussed this emergency room visit the patient stated that they were not close, but that they would not take him.  He was also seen at the [facility] in [place] on 09/09/2020.  He presented there reporting that he wanted help with alcohol abuse.  He stated he had just drank prior to coming into the emergency department.  He stated he had been drinking half a gallon daily.  He denied suicidal or homicidal ideation.  His blood alcohol at that facility showed no alcohol in his system.  The emergency room physician at that facility reviewed his records and  had noted that he had seen the patient several times before.  There was some concern for secondary gain given his frequency of presenting to their facility.  He was noted not to have any symptoms of alcohol withdrawal and a negative alcohol screen, and did not fulfill criteria for detox at that time.  Also during the interview he admitted that he had gone to the old [facility] facility, and he was very vague about whether he had been admitted, but he stated that he was "put out".  He  stated he had looked for a facility to go to there, but they were unwilling to find him a facility.  He was seen at the [facility] in [place] on 09/01/2020.  He stated at that time he was attempting to undergo admission to [facility] for alcohol and substance abuse, but that he needed medical clearance prior to going that facility.  He apparently had been seen at [facility] on somewhere around 3/26 and had an EKG that demonstrated a right bundle branch block.  There were review of the EKGs that were in the chart showed that he had a right bundle branch block that had been present on multiple EKGs.  His evaluation led to no imaging and no concern for an acute coronary syndrome.  He was discussed with social work and he was arranged to go back to [facility].  It is unclear what occurred after he returned to their facility.  He also stated that he had been at [facility] recently and was frustrated over the fact about the examination which involved some kind of physical body search, but also that they told him that he would only be able to get acute detoxification would not be able to stay in their long-term program because he had a certain counties Medicaid.  With regard to his housing the patient stated that he had been living with a woman and apparently some of her family members but that had not worked out and he had left that home approximately 3 weeks ago.  He had been staying with her for approximately 3 weeks as well.  He does suffer homelessness.  His expectation is to get into a long-term substance abuse facility.  He is also unhappy about the fact that he will be weaned off the benzodiazepines, and will not received narcotic pain medicines.  Review of the PMP database revealed his most recent prescription for oxycodone was on 09/06/2020 which he received 21 tablets.  He also received clonazepam 0.5 mg tablets #28.  Prior to that there were no prescriptions for opiates as far back as 01/2020.   His last clonazepam prescription prior to the one written for 09/06/2020 was on 06/19/2020.  The decision was made to admitting to the hospital for evaluation and stabilization.  Continued Clinical Symptoms:    The "Alcohol Use Disorders Identification Test", Guidelines for Use in Primary Care, Second Edition.  World Science writer [facility]). Score between 0-7:  no or low risk or alcohol related problems. Score between 8-15:  moderate risk of alcohol related problems. Score between 16-19:  high risk of alcohol related problems. Score 20 or above:  warrants further diagnostic evaluation for alcohol dependence and treatment.   CLINICAL FACTORS:   Depression:   Anhedonia Comorbid alcohol abuse/dependence Hopelessness Impulsivity Insomnia Alcohol/Substance Abuse/Dependencies   Musculoskeletal: Strength & Muscle Tone: within normal limits Gait & Station: broad based Patient leans: N/A  Psychiatric Specialty Exam:  Presentation  General Appearance: Disheveled  Eye  Contact:Fair  Speech:Normal Rate  Speech Volume:Normal  Handedness:Right   Mood and Affect  Mood:Irritable  Affect:Congruent   Thought Process  Thought Processes:Coherent  Descriptions of Associations:Circumstantial  Orientation:Full (Time, Place and Person)  Thought Content:Logical  History of Schizophrenia/Schizoaffective disorder:No  Duration of Psychotic Symptoms:No data recorded Hallucinations:No data recorded Ideas of Reference:No data recorded Suicidal Thoughts:Suicidal Thoughts: Yes, Passive SI Passive Intent and/or Plan: Without Intent  Homicidal Thoughts:Homicidal Thoughts: No   Sensorium  Memory:Immediate Fair; Recent Fair; Remote Fair  Judgment:Impaired  Insight:Lacking   Executive Functions  Concentration:Good  Attention Span:Good  Recall:Poor  Fund of Knowledge:Fair  Language:Good   Psychomotor Activity  Psychomotor Activity:Psychomotor Activity: Normal   Assets   Assets:Desire for Improvement; Resilience   Sleep  Sleep:Sleep: Poor    Physical Exam: Physical Exam Vitals and nursing note reviewed.  HENT:     Head: Normocephalic and atraumatic.  Pulmonary:     Effort: Pulmonary effort is normal.  Neurological:     General: No focal deficit present.     Mental Status: He is alert and oriented to person, place, and time.    ROS There were no vitals taken for this visit. There is no height or weight on file to calculate BMI.   COGNITIVE FEATURES THAT CONTRIBUTE TO RISK:  Thought constriction (tunnel vision)    SUICIDE RISK:   Mild:  Suicidal ideation of limited frequency, intensity, duration, and specificity.  There are no identifiable plans, no associated intent, mild dysphoria and related symptoms, good self-control (both objective and subjective assessment), few other risk factors, and identifiable protective factors, including available and accessible social support.  PLAN OF CARE: Patient is seen and examined.  Patient is a 50 year old male with the above-stated past psychiatric history who was transferred to our facility under the thought of detoxification.  He will be admitted to the hospital.  He will be integrated in the milieu.  He will be encouraged to attend groups.  Review of his admission laboratories from 4/6 and 4/7 revealed a blood alcohol of less than 10, drug screen positive for marijuana and cocaine.  There were no benzodiazepines or opiates in his system.  There was no alcohol in his system.  His liver function enzymes are essentially normal.  Platelets are normal.  His MCV is normal as well.  A chest x-ray revealed infiltration or scarring in the left base of the lung that was improved since the previous study on 03/23/2020.  His EKG from 09/11/2020 revealed a right bundle branch block but normal QTc interval.  We will continue his other medications as previously written.  Review of the electronic medical record revealed no  evidence that he had been on Eliquis previously in the last several ER visits.  I am going to stop that until we can confirm that he needs to be treated with this.  I will try and find out where it was started.  I am concerned with his previous alcohol intake the potential for bleed so I think for now we will stop it especially since his EKG looks like a right bundle.  He does look like he has been on an 81 mg aspirin a day, and I will make sure he does get that.  He has been treated with ibuprofen and other nonnarcotics for his back pain.  He is very unhappy about the idea that he will not receive opiates here.  He has basically refused any ideas of receiving tramadol.  He also is very unhappy  about not receiving benzodiazepines here.  I have told him that we will give him lorazepam 1 mg p.o. every 6 hours as needed a CIWA greater than 10.  The rest of his laboratories are essentially negative including a respiratory panel that was negative for influenza A, B and coronavirus.  His vital signs are stable, he is afebrile.  Pulse oximetry on room air was 98%.  I suspect there is a great deal of secondary gain involved here.  Nevertheless I will have social work attempt to see if they can get him into a substance rehabilitation facility.  I certify that inpatient services furnished can reasonably be expected to improve the patient's condition.   Antonieta PertGreg Lawson Vitalia Stough, MD 09/13/2020, 3:57 PM

## 2020-09-13 NOTE — Progress Notes (Addendum)
   09/13/20 2200  Psych Admission Type (Psych Patients Only)  Admission Status Voluntary  Psychosocial Assessment  Patient Complaints Irritability;Isolation;Anxiety  Eye Contact Fair  Facial Expression Sad  Affect Sad  Speech Logical/coherent  Interaction Assertive  Motor Activity Other (Comment) (unremarkable)  Appearance/Hygiene In scrubs  Behavior Characteristics Appropriate to situation  Mood Anxious  Thought Process  Coherency WDL  Content WDL  Delusions None reported or observed  Perception WDL  Hallucination None reported or observed  Judgment Poor  Confusion WDL  Danger to Self  Current suicidal ideation? Passive  Self-Injurious Behavior Some self-injurious ideation observed or expressed.  No lethal plan expressed   Agreement Not to Harm Self Yes  Description of Agreement Verbal agreement to not harm self  Danger to Others  Danger to Others None reported or observed    D: Patient is alert and oriented x 4. Patient denies SI/HI/ AVH but endorses chronic back and neck pain rated 9/10. Disposition is anxious and easily agitated at times. Verbally contracts for safety to this Clinical research associate.   A:  Pt was given scheduled and PRN medications. Pt was encourage to attend groups. Q 15 minute checks were done for safety. Pt was withdrawn and isolative most of the awake hours this shift. Pt was offered support and encouragement by this Clinical research associate.  No signs of distress reported by patient at present. Pt remains in disagreement with treatment plan. Pt reported desire to seek help from another facility. Safety maintained on unit.    R: Resources provided to pt per request. Will continue to monitor and assess. Safety maintained during this shift.

## 2020-09-13 NOTE — H&P (Signed)
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 Diagnosis: Active Problems:   Alcohol use with alcohol-induced mood disorder (HCC)  Long Term Goal(s): Improvement in symptoms so as ready for discharge  Short Term Goals: Ability to identify changes in lifestyle to reduce recurrence of condition will improve, Ability to verbalize feelings will improve, Ability to disclose and discuss suicidal ideas, Ability to demonstrate self-control will improve, Ability to identify and develop effective coping behaviors will improve, Ability to maintain clinical measurements within normal limits will improve, Compliance with prescribed medications will improve and Ability to identify triggers associated with substance abuse/mental health issues will improve  I certify  that inpatient services furnished can reasonably be expected to improve the patient's condition.    Antonieta Pert, MD 4/8/20224:23 PM

## 2020-09-13 NOTE — Tx Team (Signed)
Initial Treatment Plan 09/13/2020 6:17 PM Jearld Adjutant LSL:[id number]    PATIENT STRESSORS: Financial difficulties Medication change or noncompliance Substance abuse   PATIENT STRENGTHS: Wellsite geologist fund of knowledge   PATIENT IDENTIFIED PROBLEMS: Depression  Substance abuse  Suicidal ideation    "I need psychiatric help"  "I want to think clear and straight"           DISCHARGE CRITERIA:  Improved stabilization in mood, thinking, and/or behavior Need for constant or close observation no longer present Reduction of life-threatening or endangering symptoms to within safe limits Verbal commitment to aftercare and medication compliance  PRELIMINARY DISCHARGE PLAN: Outpatient therapy Medication managment  PATIENT/FAMILY INVOLVEMENT: This treatment plan has been presented to and reviewed with the patient, Kimon Loewen.  The patient and family have been given the opportunity to ask questions and make suggestions.  Levin Bacon, RN 09/13/2020, 6:17 PM

## 2020-09-13 NOTE — [facility] Counselor (Signed)
TTS reassessment: Patient presents laying in bed dressed in scrubs. He states he feels "I'm doing better. I got back on my meds and that leveled me out. I'm ready to start working on my depression and sobriety." Patient continues to endorse suicidal ideation and is requesting in patient care. He denies HI/AVH.  Per Dr. Lucianne Muss patient continues to meet in patient care criteria. [facility] reviewing.

## 2020-09-13 NOTE — ED Notes (Signed)
Provided pt with apple juice.

## 2020-09-13 NOTE — ED Provider Notes (Signed)
  Physical Exam  BP (!) 187/145 (BP Location: Left Arm)   Pulse 77   Temp 97.7 F (36.5 C) (Oral)   Resp 15   Ht 6\' 2"  (1.88 m)   Wt 113.9 kg   SpO2 96%   BMI 32.23 kg/m   Physical Exam  ED Course/Procedures     Procedures  MDM  Patient still pending placement.  Without complaints at this time.  Resting comfortably.  Well-appearing.       , MD 09/13/20 [phone]

## 2020-09-13 NOTE — [facility] Group Notes (Signed)
Patient did not attend morning group.  
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
## 2020-09-13 NOTE — ED Notes (Signed)
Face to Face assessment by TTS this morning 

## 2020-09-14 MED ORDER — NICOTINE 21 MG/24HR TD PT24
21.0000 mg | MEDICATED_PATCH | Freq: Once | TRANSDERMAL | Status: AC
  Administered 2020-09-14 (×2): 21 mg via TRANSDERMAL
  Filled 2020-09-14: qty 1

## 2020-09-14 MED ORDER — METHOCARBAMOL 500 MG PO TABS
500.0000 mg | ORAL_TABLET | Freq: Three times a day (TID) | ORAL | Status: DC | PRN
  Administered 2020-09-14 – 2020-09-17 (×5): 500 mg via ORAL
  Filled 2020-09-14 (×5): qty 1

## 2020-09-14 MED ORDER — VENLAFAXINE HCL ER 150 MG PO CP24
150.0000 mg | ORAL_CAPSULE | Freq: Every day | ORAL | Status: DC
  Administered 2020-09-15 – 2020-09-17 (×3): 150 mg via ORAL
  Filled 2020-09-14 (×5): qty 1
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
## 2020-09-14 NOTE — [facility] Group Notes (Signed)
[facility] Group Notes: (Clinical Social Work)   09/14/2020      Type of Therapy:  Group Therapy   Participation Level:  Did Not Attend - was invited both individually by [facility] and by overhead announcement, chose not to attend.   Ambrose Mantle, LCSW 09/14/2020, 3:32 PM

## 2020-09-14 NOTE — Progress Notes (Signed)
   09/14/20 2341  COVID-19 Daily Checkoff  Have you had a fever (temp > 37.80C/100F)  in the past 24 hours?  No  If you have had runny nose, nasal congestion, sneezing in the past 24 hours, has it worsened? No  COVID-19 EXPOSURE  Have you traveled outside the state in the past 14 days? No  Have you been in contact with someone with a confirmed diagnosis of COVID-19 or PUI in the past 14 days without wearing appropriate PPE? No  Have you been living in the same home as a person with confirmed diagnosis of COVID-19 or a PUI (household contact)? No  Have you been diagnosed with COVID-19? No
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
## 2020-09-14 NOTE — [facility] Group Notes (Signed)
.  Psychoeducational Group Note  Date: 09/14/2020 Time: 0900-1000    Goal Setting   Purpose of Group: This group helps to provide patients with the steps of setting a goal that is specific, measurable, attainable, realistic and time specific. A discussion on how we keep ourselves stuck with negative self talk.    Participation Level:  low  Participation Quality:  Appropriate  Affect:  flat  Cognitive:  Appropriate  Insight:  Poor   Engagement in Group:  Engaged in the group to complain about staff Additional Comments:  Rates his energy at a 0/10. Staes that he is in so much pain that he cannot concentrate. Interrupted other Patinets when they were speaking.  Dione Housekeeper

## 2020-09-14 NOTE — Progress Notes (Signed)
   09/14/20 0547  Vital Signs  Temp 98.5 F (36.9 C)  Temp Source Oral  Pulse Rate 85  Resp 18  BP 113/83  BP Method Automatic  Oxygen Therapy  SpO2 98 %   D: Patient admits to some SI without a plan. Pt. Verbally contracts for safety. Pt. Denies HI/AVH. Pt. Out in open areas and attended multiple groups. Pt. Complained of detoxing and in his back. Pt. Rated  Both anxiety and depression 10/10. Pt shaved in the bathroom with MHT present. A:  Patient took scheduled medicine.Pt given 25 mg of vistaril for anxiety and 800mg  of Advil for pain and later 50 mg of ultram for pain  Support and encouragement provided Routine safety checks conducted every 15 minutes. Patient  Informed to notify staff with any concerns.   R: Pt. Verbally contracts for safety. Safety maintained.

## 2020-09-14 NOTE — Progress Notes (Signed)
[facility] Group Notes:  (Nursing/MHT/Case Management/Adjunct)  Date:  09/14/2020  Time:  2000  Type of Therapy:  wrap up group  Participation Level:  Active  Participation Quality:  Appropriate, Attentive, Sharing and Supportive  Affect:  Appropriate  Cognitive:  Alert  Insight:  Improving  Engagement in Group:  Engaged  Modes of Intervention:  Clarification, Education and Support  Summary of Progress/Problems: Positive thinking and positive change were discussed.   Jonathan Mcbride 09/14/2020, 11:21 PM

## 2020-09-15 MED ORDER — NICOTINE 21 MG/24HR TD PT24
21.0000 mg | MEDICATED_PATCH | Freq: Every day | TRANSDERMAL | Status: DC
  Administered 2020-09-15 – 2020-09-17 (×3): 21 mg via TRANSDERMAL
  Filled 2020-09-15 (×5): qty 1

## 2020-09-15 MED ORDER — QUETIAPINE FUMARATE 50 MG PO TABS
50.0000 mg | ORAL_TABLET | Freq: Two times a day (BID) | ORAL | Status: DC
  Administered 2020-09-15 – 2020-09-17 (×4): 50 mg via ORAL
  Filled 2020-09-15 (×8): qty 1

## 2020-09-15 NOTE — [facility] Group Notes (Signed)
Psychoeducational Group Note  Date: 09-15-20 Time:  1300  Group Topic/Focus:  Making Healthy Choices:   The focus of this group is to help patients identify negative/unhealthy choices they were using prior to admission and identify positive/healthier coping strategies to replace them upon discharge.In this group, patients started asking about the brain and how the brain works with and how the chemicals work for those who use substances, the pros and cons of saboxone.  Participation Level:  Did not attend   Jonathan Mcbride A  

## 2020-09-15 NOTE — [facility] Group Notes (Signed)
Adult Psychoeducational Group Not Date:  09/15/2020 Time:  0900-1045 Group Topic/Focus: PROGRESSIVE RELAXATION. A group where deep breathing is taught and tensing and relaxation muscle groups is used. Imagery is used as well.  Pts are asked to imagine 3 pillars that hold them up when they are not able to hold themselves up.  Participation Level:  Did not attend   Participation Quality:  Appropriate   Jonathan Mcbride
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
## 2020-09-15 NOTE — Progress Notes (Signed)
D:  Patient's self inventory sheet, patient has poor sleep, sleep medication not working.  Fair appetite, low energy level, poor concentration.  Rate depression 3, hopeless 2, anxiety 10.  Withdrawals, tremors, agitation, nausea.  Denied SI.  Physical problems, pain, headaches.  Physical pain, worst pain #10, back.  Goal stop drinking, and stop back pain.  Plans to go to classes, talk to counselors.  No discharge plans. A:  Medications administered per MD orders.  Emotional support and encouragement given patient. R:  Denied SI and HI, contracts for safety.  Denied A/V hallucinations.  Denied pain.  Safety maintained with 15 minute checks.

## 2020-09-15 NOTE — Progress Notes (Signed)
D: Patient presents with pleasant affect at time of assessment. Patient reports having a good day and enjoying going outside. Patient is positive for passive SI but verbally contracts for safety. Patient denies AH/VH/HI at this time.  A: Provided positive reinforcement and encouragement.  R: Patient cooperative and receptive to efforts. Patient remains safe on the unit.   09/15/20 2112  Psych Admission Type (Psych Patients Only)  Admission Status Voluntary  Psychosocial Assessment  Patient Complaints None  Eye Contact Fair  Facial Expression Animated  Affect Appropriate to circumstance  Speech Logical/coherent  Interaction Assertive  Motor Activity Other (Comment) (WDL)  Appearance/Hygiene Unremarkable  Behavior Characteristics Cooperative;Appropriate to situation  Mood Pleasant  Thought Process  Coherency WDL  Content WDL  Delusions None reported or observed  Perception WDL  Hallucination None reported or observed  Judgment Poor  Confusion None  Danger to Self  Current suicidal ideation? Passive  Self-Injurious Behavior No self-injurious ideation or behavior indicators observed or expressed   Agreement Not to Harm Self Yes  Description of Agreement Verbal Contract  Danger to Others  Danger to Others None reported or observed

## 2020-09-15 NOTE — Plan of Care (Signed)
Nurse discussed coping skills with patient.  

## 2020-09-16 LAB — LIPID PANEL
Cholesterol: 196 mg/dL (ref 0–200)
HDL: 45 mg/dL (ref >40)
LDL Cholesterol: 74 mg/dL (ref 0–99)
Total CHOL/HDL Ratio: 4.4 RATIO
Triglycerides: 386 mg/dL — ABNORMAL HIGH (ref <150)
VLDL: 77 mg/dL — ABNORMAL HIGH (ref 0–40)

## 2020-09-16 LAB — HEMOGLOBIN A1C
Hgb A1c MFr Bld: 5.2 % (ref 4.8–5.6)
Mean Plasma Glucose: 102.54 mg/dL

## 2020-09-16 LAB — TSH: TSH: 2.746 u[IU]/mL (ref 0.350–4.500)

## 2020-09-16 NOTE — Progress Notes (Addendum)
   09/16/20 2020  Psych Admission Type (Psych Patients Only)  Admission Status Voluntary  Psychosocial Assessment  Patient Complaints Anxiety;Depression  Eye Contact Fair  Facial Expression Anxious  Affect Appropriate to circumstance  Speech Logical/coherent  Interaction Assertive  Motor Activity Other (Comment) (wnl)  Appearance/Hygiene Unremarkable  Behavior Characteristics Cooperative;Anxious  Mood Anxious  Thought Process  Coherency WDL  Content WDL  Delusions None reported or observed  Perception WDL  Hallucination None reported or observed  Judgment WDL  Confusion None  Danger to Self  Current suicidal ideation? Denies  Danger to Others  Danger to Others None reported or observed   Pt seen at nurse's station. Pt denies SI, HI, AVH. Rates his pain 10/10. Pt has chronic back pain. Pt was given all his available medications for pain at 1517. Pt knows that he cannot have anything else until 2317. Pt states that today was the best day he has had since admission. Pt went outside today for recreation. Pt rates anxiety 10/10 and depression 5/10. Pt says he wants rehab after discharge. Pt also asked for nighttime meds early because he says he likes to be in bed by 9 pm. Pt wanted another dose of Ellipta but told it is daily and he has already had that dose. "My doctor prescribed Spiriva and that's twice a day but they said they don't have it here."

## 2020-09-16 NOTE — [facility] Group Notes (Signed)
ADULT GRIEF GROUP NOTE:   Spiritual care group on grief and loss facilitated by Kathleen Argue, B[facility]   Group Goal:   Support / Education around grief and loss   Members engage in facilitated group support and psycho-social education.   Group Description:   Following introductions and group rules, group members engaged in facilitated group dialog and support around topic of loss, with particular support around experiences of loss in their lives. Group Identified types of loss (relationships / self / things) and identified patterns, circumstances, and changes that precipitate losses. Reflected on thoughts / feelings around loss, normalized grief responses, and recognized variety in grief experience. Group noted Worden's four tasks of grief in discussion.   Group drew on Adlerian / Rogerian, narrative, MI,   Patient Progress:  Pt attended and participated in group.  He shared appropriately about grief and loss in his own life and actively engaged with other participants in the group.  Chaplain Dyanne Carrel, Bcc Pager, [phone] Office: [phone] 3:27 PM

## 2020-09-16 NOTE — [facility] Counselor (Signed)
CSW gave pt information on [employer] and a homelessness resource packet.   Fredirick Lathe, LCSWA Clinicial Social Worker [employer]

## 2020-09-16 NOTE — Progress Notes (Signed)
Pt presents with flat affect and depressed mood.  Pt complains of pain in lower/mid back.  Prn tramadol given and robaxin given with some pain relief noted.  Pt interested in rehab post d/c from [facility]. RN administered medications and actively listened to pt describe feelings and concerns.  Pt is safe on the unit with q 15 min checks in place.

## 2020-09-16 NOTE — [facility] Group Notes (Signed)
Occupational Therapy Group Note Date: 09/16/2020 Group Topic/Focus: Stress Management  Group Description: Group encouraged increased participation and engagement through discussion focused on topic of stress management. Patients engaged interactively to discuss components of stress including physical signs, emotional signs, negative management strategies, and positive management strategies. Each individual identified one new stress management strategy they would like to try moving forward.    Therapeutic Goals: Identify current stressors Identify healthy vs unhealthy stress management strategies/techniques Discuss and identify physical and emotional signs of stress Participation Level: Active   Participation Quality: Independent   Behavior: Cooperative and Interactive   Speech/Thought Process: Focused   Affect/Mood: Full range   Insight: Fair   Judgement: Fair   Individualization: Joe was active in their participation of group discussion/activity. Pt identified "loss of control of everything" as something that is stressful here in the hospital, and "my work and being away from it" as something that is stressful outside the hospital. Pt offered support to peers and other groups members, also appeared receptive to education and strategies provided by clinician to manage stress in the future.   Modes of Intervention: Discussion, Education and Support  Patient Response to Interventions:  Attentive, Engaged, Receptive and Interested   Plan: Continue to engage patient in OT groups 2 - 3x/week.  09/16/2020  Donne Hazel, MOT, OTR/L
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
## 2020-09-16 NOTE — Tx Team (Signed)
Interdisciplinary Treatment and Diagnostic Plan Update  09/16/2020 Time of Session: 10:55am Jonathan Mcbride MRN: [id number]  Principal Diagnosis: Alcohol use with alcohol-induced mood disorder (HCC)  Secondary Diagnoses: Principal Problem:   Alcohol use with alcohol-induced mood disorder (HCC)   Current Medications:  Current Facility-Administered Medications  Medication Dose Route Frequency Provider Last Rate Last Admin  . acetaminophen (TYLENOL) tablet 650 mg  650 mg Oral Q6H PRN Antonieta Pert, MD   650 mg at 09/16/20 0911  . albuterol (VENTOLIN HFA) 108 (90 Base) MCG/ACT inhaler 1-2 puff  1-2 puff Inhalation Q6H PRN Antonieta Pert, MD   2 puff at 09/16/20 0907  . alum & mag hydroxide-simeth (MAALOX/MYLANTA) 200-200-20 MG/5ML suspension 30 mL  30 mL Oral Q4H PRN Antonieta Pert, MD      . aspirin EC tablet 81 mg  81 mg Oral Daily Antonieta Pert, MD   81 mg at 09/16/20 0908  . atenolol (TENORMIN) tablet 12.5 mg  12.5 mg Oral BID Antonieta Pert, MD   12.5 mg at 09/16/20 0908  . atorvastatin (LIPITOR) tablet 40 mg  40 mg Oral QHS Antonieta Pert, MD   40 mg at 09/15/20 2112  . furosemide (LASIX) tablet 40 mg  40 mg Oral Daily Antonieta Pert, MD   40 mg at 09/16/20 0909  . gabapentin (NEURONTIN) capsule 800 mg  800 mg Oral TID Antonieta Pert, MD   800 mg at 09/16/20 0908  . hydrOXYzine (ATARAX/VISTARIL) tablet 25 mg  25 mg Oral TID PRN Antonieta Pert, MD   25 mg at 09/16/20 0910  . ibuprofen (ADVIL) tablet 800 mg  800 mg Oral TID PRN Nwoko, Uchenna E, PA   800 mg at 09/16/20 0713  . LORazepam (ATIVAN) tablet 1 mg  1 mg Oral Q6H PRN Lenard Lance, FNP   1 mg at 09/14/20 2103  . magnesium hydroxide (MILK OF MAGNESIA) suspension 30 mL  30 mL Oral Daily PRN Antonieta Pert, MD      . methocarbamol (ROBAXIN) tablet 500 mg  500 mg Oral Q8H PRN Antonieta Pert, MD   500 mg at 09/16/20 0910  . nicotine (NICODERM CQ - dosed in mg/24 hours) patch 21 mg  21 mg  Transdermal Daily Antonieta Pert, MD   21 mg at 09/16/20 0910  . pantoprazole (PROTONIX) EC tablet 40 mg  40 mg Oral Daily Antonieta Pert, MD   40 mg at 09/16/20 0908  . QUEtiapine (SEROQUEL) tablet 400 mg  400 mg Oral QHS Antonieta Pert, MD   400 mg at 09/15/20 2111  . QUEtiapine (SEROQUEL) tablet 50 mg  50 mg Oral BID Armandina Stammer I, NP   50 mg at 09/16/20 0908  . traMADol (ULTRAM) tablet 50 mg  50 mg Oral Q6H PRN Antonieta Pert, MD   50 mg at 09/16/20 0620  . umeclidinium bromide (INCRUSE ELLIPTA) 62.5 MCG/INH 1 puff  1 puff Inhalation Daily Antonieta Pert, MD   1 puff at 09/16/20 0907  . venlafaxine XR (EFFEXOR-XR) 24 hr capsule 150 mg  150 mg Oral Q breakfast Antonieta Pert, MD   150 mg at 09/16/20 1660   PTA Medications: Medications Prior to Admission  Medication Sig Dispense Refill Last Dose  . albuterol (PROVENTIL HFA;VENTOLIN HFA) 108 (90 BASE) MCG/ACT inhaler Inhale 1-2 puffs into the lungs every 6 (six) hours as needed for wheezing or shortness of breath. 1 each 0   . apixaban (  ELIQUIS) 5 MG TABS tablet Take 5 mg by mouth daily.     Marland Kitchen aspirin EC 81 MG tablet Take 81 mg by mouth daily. Swallow whole.     Marland Kitchen atenolol (TENORMIN) 25 MG tablet Take 0.5 tablets (12.5 mg total) by mouth 2 (two) times daily. (Patient taking differently: Take 25 mg by mouth 2 (two) times daily.) 30 tablet 0   . atorvastatin (LIPITOR) 40 MG tablet Take 40 mg by mouth at bedtime.     . benztropine (COGENTIN) 1 MG tablet Take 1 mg by mouth daily.     . celecoxib (CELEBREX) 200 MG capsule Take 200 mg by mouth daily.     . clonazePAM (KLONOPIN) 0.5 MG tablet Take 1 mg by mouth 2 (two) times daily.     . cyclobenzaprine (FLEXERIL) 10 MG tablet Take 10 mg by mouth 2 (two) times daily as needed.     . furosemide (LASIX) 40 MG tablet Take 40 mg by mouth daily.     Marland Kitchen gabapentin (NEURONTIN) 400 MG capsule Take 800 mg by mouth 4 (four) times daily.     . hydrOXYzine (VISTARIL) 25 MG capsule Take 25  mg by mouth every 6 (six) hours as needed.     . lamoTRIgine (LAMICTAL) 100 MG tablet Take 1 tablet (100 mg total) by mouth daily. 7 tablet 0   . oxyCODONE-acetaminophen (PERCOCET) 10-325 MG tablet Take 1 tablet by mouth 3 (three) times daily as needed.     . pantoprazole (PROTONIX) 20 MG tablet Take 20 mg by mouth daily.     . QUEtiapine (SEROQUEL) 400 MG tablet Take 400 mg by mouth at bedtime.     Marland Kitchen tiotropium (SPIRIVA) 18 MCG inhalation capsule Place 18 mcg into inhaler and inhale daily.     Marland Kitchen venlafaxine XR (EFFEXOR-XR) 75 MG 24 hr capsule Take 75 mg by mouth daily.       Patient Stressors: Financial difficulties Medication change or noncompliance Substance abuse  Patient Strengths: Wellsite geologist fund of knowledge  Treatment Modalities: Medication Management, Group therapy, Case management,  1 to 1 session with clinician, Psychoeducation, Recreational therapy.   Physician Treatment Plan for Primary Diagnosis: Alcohol use with alcohol-induced mood disorder (HCC) Long Term Goal(s): Improvement in symptoms so as ready for discharge Improvement in symptoms so as ready for discharge   Short Term Goals: Ability to identify changes in lifestyle to reduce recurrence of condition will improve Ability to verbalize feelings will improve Ability to disclose and discuss suicidal ideas Ability to demonstrate self-control will improve Ability to identify and develop effective coping behaviors will improve Ability to maintain clinical measurements within normal limits will improve Compliance with prescribed medications will improve Ability to identify triggers associated with substance abuse/mental health issues will improve Ability to identify changes in lifestyle to reduce recurrence of condition will improve Ability to verbalize feelings will improve Ability to disclose and discuss suicidal ideas Ability to demonstrate self-control will improve Ability to identify and  develop effective coping behaviors will improve Ability to maintain clinical measurements within normal limits will improve Compliance with prescribed medications will improve Ability to identify triggers associated with substance abuse/mental health issues will improve  Medication Management: Evaluate patient's response, side effects, and tolerance of medication regimen.  Therapeutic Interventions: 1 to 1 sessions, Unit Group sessions and Medication administration.  Evaluation of Outcomes: Progressing  Physician Treatment Plan for Secondary Diagnosis: Principal Problem:   Alcohol use with alcohol-induced mood disorder (HCC)  Long Term Goal(s): Improvement  in symptoms so as ready for discharge Improvement in symptoms so as ready for discharge   Short Term Goals: Ability to identify changes in lifestyle to reduce recurrence of condition will improve Ability to verbalize feelings will improve Ability to disclose and discuss suicidal ideas Ability to demonstrate self-control will improve Ability to identify and develop effective coping behaviors will improve Ability to maintain clinical measurements within normal limits will improve Compliance with prescribed medications will improve Ability to identify triggers associated with substance abuse/mental health issues will improve Ability to identify changes in lifestyle to reduce recurrence of condition will improve Ability to verbalize feelings will improve Ability to disclose and discuss suicidal ideas Ability to demonstrate self-control will improve Ability to identify and develop effective coping behaviors will improve Ability to maintain clinical measurements within normal limits will improve Compliance with prescribed medications will improve Ability to identify triggers associated with substance abuse/mental health issues will improve     Medication Management: Evaluate patient's response, side effects, and tolerance of medication  regimen.  Therapeutic Interventions: 1 to 1 sessions, Unit Group sessions and Medication administration.  Evaluation of Outcomes: Progressing   RN Treatment Plan for Primary Diagnosis: Alcohol use with alcohol-induced mood disorder (HCC) Long Term Goal(s): Knowledge of disease and therapeutic regimen to maintain health will improve  Short Term Goals: Ability to verbalize frustration and anger appropriately will improve, Ability to demonstrate self-control, Ability to verbalize feelings will improve and Ability to identify and develop effective coping behaviors will improve  Medication Management: RN will administer medications as ordered by provider, will assess and evaluate patient's response and provide education to patient for prescribed medication. RN will report any adverse and/or side effects to prescribing provider.  Therapeutic Interventions: 1 on 1 counseling sessions, Psychoeducation, Medication administration, Evaluate responses to treatment, Monitor vital signs and CBGs as ordered, Perform/monitor CIWA, COWS, AIMS and Fall Risk screenings as ordered, Perform wound care treatments as ordered.  Evaluation of Outcomes: Progressing   LCSW Treatment Plan for Primary Diagnosis: Alcohol use with alcohol-induced mood disorder (HCC) Long Term Goal(s): Safe transition to appropriate next level of care at discharge, Engage patient in therapeutic group addressing interpersonal concerns.  Short Term Goals: Engage patient in aftercare planning with referrals and resources, Increase social support, Increase ability to appropriately verbalize feelings, Increase emotional regulation, Facilitate acceptance of mental health diagnosis and concerns, Facilitate patient progression through stages of change regarding substance use diagnoses and concerns, Identify triggers associated with mental health/substance abuse issues and Increase skills for wellness and recovery  Therapeutic Interventions: Assess  for all discharge needs, 1 to 1 time with Social worker, Explore available resources and support systems, Assess for adequacy in community support network, Educate family and significant other(s) on suicide prevention, Complete Psychosocial Assessment, Interpersonal group therapy.  Evaluation of Outcomes: Progressing   Progress in Treatment: Attending groups: No. Participating in groups: No. Taking medication as prescribed: Yes. Toleration medication: Yes. Family/Significant other contact made: No, will contact:  Mother, Tanna FurryRebecca Queen, [phone] Patient understands diagnosis: Yes. Discussing patient identified problems/goals with staff: Yes. Medical problems stabilized or resolved: Yes. Denies suicidal/homicidal ideation: Yes. Issues/concerns per patient self-inventory: No.  New problem(s) identified: No, Describe:  none reported or observed  New Short Term/Long Term Goal(s): detox, medication management for mood stabilization; elimination of SI thoughts; development of comprehensive mental wellness/sobriety plan    Patient Goals:  "work on my sobriety"  Discharge Plan or Barriers: Patient recently admitted. CSW will continue to follow and assess for appropriate  referrals and possible discharge planning.    Reason for Continuation of Hospitalization: Anxiety Depression Medication stabilization  Estimated Length of Stay: 3-5 days  Attendees: Patient: Jonathan Mcbride 09/16/2020   Physician: Clifton Custard, MD 09/16/2020   Nursing:  09/16/2020   RN Care Manager: 09/16/2020   Social Worker: Anson [place], LCSW 09/16/2020   Recreational Therapist:  09/16/2020   Other:  09/16/2020   Other:  09/16/2020   Other: 09/16/2020       Scribe for Treatment Team: Beatris Si, LCSW 09/16/2020 12:00 PM

## 2020-09-16 NOTE — Progress Notes (Signed)
Recreation Therapy Notes  Date: 4.11.22 Time: 0930 Location: [address] Dayroom  Group Topic: Stress Management   Goal Area(s) Addresses:  Patient will actively participate in stress management techniques presented during session.  Patient will successfully identify benefit of practicing stress management post d/c.   Intervention: Guided exercise with ambient sound and script  Activity :Guided Imagery  LRT read [place] script that focused envisioning being in [place] in the [place] and enjoying the sights, sounds and feeling of being outdoors.  Patients were to listen and follow along as script was read to fully engage in activity.   Education:  Stress Management, Discharge Planning.   Education Outcome: Acknowledges education  Clinical Observations/Feedback: Patient did not attend group session.   Jonathan Mcbride, LRT/CTRS         Jonathan Mcbride, Jonathan Mcbride 09/16/2020 11:00 AM

## 2020-09-16 NOTE — [facility] Group Notes (Signed)
LCSW Group Therapy Note  Type of Therapy/Topic: Group Therapy: Six Dimensions of Wellness  Participation Level: Active  Description of Group: This group will address the concept of wellness and the six concepts of wellness: occupational, physical, social, intellectual, spiritual, and emotional. Patients will be encouraged to process areas in their lives that are out of balance and identify reasons for remaining unbalanced. Patients will be encouraged to explore ways to practice healthy habits daily to attain better physical and mental health outcomes.  Therapeutic Goals: 1. Identify aspects of wellness that they are doing well. 2. Identify aspects of wellness that they would like to improve upon. 3. Identify one action they can take to improve an aspect of wellness in their lives.   Summary of Patient Progress: Pt was seen interacting with his peers appropriately.    Therapeutic Modalities: Cognitive Behavioral Therapy Solution-Focused Therapy Relapse Prevention  Christinia Lambeth, LCSWA Clinicial Social Worker [facility] 

## 2020-09-17 DIAGNOSIS — F1094 Alcohol use, unspecified with alcohol-induced mood disorder: Secondary | ICD-10-CM

## 2020-09-17 MED ORDER — VENLAFAXINE HCL ER 150 MG PO CP24: 150.0000 mg | ORAL_CAPSULE | Freq: Every day | ORAL | 0 refills | Status: AC

## 2020-09-17 MED ORDER — QUETIAPINE FUMARATE 50 MG PO TABS: 50.0000 mg | ORAL_TABLET | Freq: Two times a day (BID) | ORAL | 0 refills | Status: AC

## 2020-09-17 MED ORDER — ATORVASTATIN CALCIUM 40 MG PO TABS: 40.0000 mg | ORAL_TABLET | Freq: Every day | ORAL | 0 refills | Status: AC

## 2020-09-17 MED ORDER — GABAPENTIN 400 MG PO CAPS: 800.0000 mg | ORAL_CAPSULE | Freq: Three times a day (TID) | ORAL | 0 refills | Status: AC

## 2020-09-17 MED ORDER — GABAPENTIN 800 MG PO TABS
800.0000 mg | ORAL_TABLET | Freq: Three times a day (TID) | ORAL | Status: DC
  Filled 2020-09-17 (×3): qty 42

## 2020-09-17 MED ORDER — PANTOPRAZOLE SODIUM 40 MG PO TBEC: 40.0000 mg | DELAYED_RELEASE_TABLET | Freq: Every day | ORAL | 0 refills | Status: AC

## 2020-09-17 MED ORDER — FUROSEMIDE 40 MG PO TABS: 40.0000 mg | ORAL_TABLET | Freq: Every day | ORAL | 0 refills | Status: AC

## 2020-09-17 MED ORDER — ALBUTEROL SULFATE HFA 108 (90 BASE) MCG/ACT IN AERS: 1.0000 | INHALATION_SPRAY | Freq: Four times a day (QID) | RESPIRATORY_TRACT | 0 refills | Status: AC | PRN

## 2020-09-17 MED ORDER — ATENOLOL 25 MG PO TABS: 12.5000 mg | ORAL_TABLET | Freq: Two times a day (BID) | ORAL | 0 refills | Status: AC

## 2020-09-17 MED ORDER — HYDROXYZINE HCL 25 MG PO TABS: 25.0000 mg | ORAL_TABLET | Freq: Three times a day (TID) | ORAL | 0 refills | Status: AC | PRN

## 2020-09-17 MED ORDER — QUETIAPINE FUMARATE 400 MG PO TABS: 400.0000 mg | ORAL_TABLET | Freq: Every day | ORAL | 0 refills | Status: AC

## 2020-09-17 MED ORDER — UMECLIDINIUM BROMIDE 62.5 MCG/INH IN AEPB: 1.0000 | INHALATION_SPRAY | Freq: Every day | RESPIRATORY_TRACT | 0 refills | Status: AC

## 2020-09-17 NOTE — [facility] Group Notes (Signed)
[facility] Group Notes:  (Nursing/MHT/Case Management/Adjunct)  Date:  09/17/2020  Time:  10:58 AM  Type of Therapy:  Psychoeducational Skills  Participation Level:  Did Not Attend  Participation Quality:  na  Affect:  na  Cognitive:  na  Insight:  None  Engagement in Group:  na  Modes of Intervention:  na  Summary of Progress/Problems: Did not attend Jonathan Mcbride 09/17/2020, 10:58 AM

## 2020-09-17 NOTE — [facility] Suicide Risk Assessment (Signed)
[facility] INPATIENT:  Family/Significant Other Suicide Prevention Education  Suicide Prevention Education:  Contact Attempts: mother Tanna Furry [phone], (name of family member/significant other) has been identified by the patient as the family member/significant other with whom the patient will be residing, and identified as the person(s) who will aid the patient in the event of a mental health crisis.  With written consent from the patient, two attempts were made to provide suicide prevention education, prior to and/or following the patient's discharge.  We were unsuccessful in providing suicide prevention education.  A suicide education pamphlet was given to the patient to share with family/significant other.  Date and time of first attempt:09/16/20/ 2:00pm Date and time of second attempt: 09/16/20 9:45am  CSW completed safety planning with pt and placed pamphlet in chart.   Felizardo Hoffmann 09/17/2020, 9:56 AM

## 2020-09-17 NOTE — Progress Notes (Signed)
RN met with pt and reviewed pt's discharge instructions.  Pt verbalized understanding of discharge instructions and pt did not have any questions. RN reviewed and provided pt with a copy of SRA, AVS and Transition Record.  RN returned pt's belongings to pt.  Paper prescriptions and medication samples were given to pt.  Pt denied SI/HI/AVH and voiced no concerns.  Pt was appreciative of the care pt received at [facility].  Patient discharged to the lobby without incident.  
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
## 2020-09-17 NOTE — Progress Notes (Signed)
  [facility] Adult Case Management Discharge Plan :  Will you be returning to the same living situation after discharge:  Yes,  to [facility] At discharge, do you have transportation home?: No. Safe Transport will be arranged Do you have the ability to pay for your medications: Yes,  has medicaid  Release of information consent forms completed and in the chart;  Patient's signature needed at discharge.  Patient to Follow up at:  Follow-up Information    Services, [facility]. Call.   Why: A referral has been made to this provider for residential treatment services. Contact information: [address] [place] [address] [phone]        [facility]. Call.   Why: Please go to this provider for outpatient therapy and medication management services. Contact information: [address] [place], [place] [address]  P: [phone]              Next level of care provider has access to [facility] Link:no  Safety Planning and Suicide Prevention discussed: Yes,  w/. pt  Have you used any form of tobacco in the last 30 days? (Cigarettes, Smokeless Tobacco, Cigars, and/or Pipes): Yes  Has patient been referred to the Quitline?: Patient refused referral  Patient has been referred for addiction treatment: Yes  Felizardo Hoffmann, LCSWA 09/17/2020, 9:59 AM

## 2021-03-05 DIAGNOSIS — E876 Hypokalemia: Secondary | ICD-10-CM

## 2021-03-05 NOTE — ED Triage Notes (Signed)
Pt states he feels that he is in AFIB rhythm and has ETOH on board.

## 2021-03-05 NOTE — ED Provider Notes (Signed)
Emergency Department Provider Note                   PCP:                None Provider               Age: 50 y.o.      Sex: male       ICD-10-CM    1. Palpitations  R00.2       2. Hypokalemia  E87.6           DISPOSITION Decision To Discharge 03/05/2021 11:35:29 PM       MDM  Number of Diagnoses or Management Options  Hypokalemia  Palpitations  Diagnosis management comments: I wore appropriate PPE throughout this patient's ED visit. Melvin Rocher, MD, 9:52 PM      Patient was vaping in the hallway when I came to examine him.  He appears intoxicated.    11:38 PM  No episodes of atrial fibrillation in the emergency department.  Advise cardiology follow-up.       Amount and/or Complexity of Data Reviewed  Clinical lab tests: ordered and reviewed  Tests in the radiology section of CPT??: ordered and reviewed  Review and summarize past medical records: yes  Independent visualization of images, tracings, or specimens: yes               Orders Placed This Encounter   Procedures    XR CHEST (2 VW)    CBC    Comprehensive Metabolic Panel    Troponin    Magnesium    ADULT DIET; Regular    Cardiac Monitor    Pulse Oximetry    Nursing communication    EKG 12 Lead    Saline lock IV        Medications   potassium bicarb-citric acid (EFFER-K) effervescent tablet 40 mEq (has no administration in time range)       New Prescriptions    No medications on file        Melvin Horn is a 50 y.o. male who presents to the Emergency Department with chief complaint of    Chief Complaint   Patient presents with    Alcohol Intoxication    Atrial Fibrillation      50 year old male with history of paroxysmal atrial fibrillation on Eliquis presents with 3 episodes of palpitations while practicing mixed martial arts.  He reports it being associated with chest soreness and shortness of breath.  Symptoms have resolved, but chest still feels sore.  He reports recent congestion, vomiting, and diarrhea.  He had a negative COVID test yesterday.  No  documented fevers.  Admits to drinking alcohol today.        All other systems reviewed and are negative unless otherwise stated in the history of present illness section.    Review of Systems   Constitutional:  Negative for fever.   HENT:  Positive for congestion. Negative for facial swelling.    Eyes:  Negative for visual disturbance.   Respiratory:  Positive for chest tightness and shortness of breath.    Cardiovascular:  Positive for chest pain.   Gastrointestinal:  Positive for diarrhea and vomiting. Negative for abdominal pain.   Musculoskeletal:  Negative for joint swelling.   Skin:  Negative for rash.   Neurological:  Negative for speech difficulty.   Psychiatric/Behavioral:  Negative for confusion.    All other systems reviewed and are negative.  Past Medical History:   Diagnosis Date    Atrial fibrillation ([place])     Hypertension         No past surgical history on file.     No family history on file.     Social History     Socioeconomic History    Marital status: Single   Tobacco Use    Smoking status: Unknown   Vaping Use    Vaping Use: Every day   Substance and Sexual Activity    Alcohol use: Yes        Allergies: Patient has no known allergies.    Previous Medications    APIXABAN (ELIQUIS) 5 MG TABS TABLET    Take by mouth 2 times daily    METOPROLOL TARTRATE (LOPRESSOR) 50 MG TABLET    Take 50 mg by mouth 2 times daily        Vitals signs and nursing note reviewed.   Patient Vitals for the past 4 hrs:   Temp Pulse Resp BP SpO2   03/05/21 2053 97.9 ??F (36.6 ??C) 94 17 (!) 123/50 97 %          Physical Exam  Vitals and nursing note reviewed.   Constitutional:       Appearance: Normal appearance.   HENT:      Head: Normocephalic and atraumatic.      Nose: Nose normal.      Mouth/Throat:      Mouth: Mucous membranes are moist.   Eyes:      Extraocular Movements: Extraocular movements intact.      Pupils: Pupils are equal, round, and reactive to light.   Cardiovascular:      Rate and Rhythm: Normal rate  and regular rhythm.   Pulmonary:      Effort: Pulmonary effort is normal. No respiratory distress.   Abdominal:      General: Abdomen is flat. There is no distension.   Musculoskeletal:         General: No deformity. Normal range of motion.      Cervical back: Normal range of motion and neck supple.   Skin:     General: Skin is warm and dry.   Neurological:      General: No focal deficit present.      Mental Status: He is alert. Mental status is at baseline.   Psychiatric:         Speech: Speech is slurred.         Behavior: Behavior is slowed.        Procedures    ED EKG Interpretation  EKG was interpreted in the absence of a cardiologist.    Rate: 90  EKG Interpretation: EKG Interpretation: sinus rhythm, left bundle branch block, and right bundle branch block, bifascicular block, LVH  ST Segments: Nonspecific ST segments - NO STEMI    Results for orders placed or performed during the hospital encounter of 03/05/21   XR CHEST (2 VW)    Narrative    Frontal and lateral views of the chest     COMPARISON: none    INDICATION: Chest pain    FINDINGS:     There is no focal pulmonary consolidation, pleural effusion or pneumothorax. No  pulmonary edema. Cardiomediastinal silhouette is within normal limits.  Surrounding bones are intact.        Impression    1. Negative chest x-ray.   CBC   Result Value Ref Range    WBC 9.8 4.3 -  11.1 K/uL    RBC 4.70 4.23 - 5.6 M/uL    Hemoglobin 15.6 13.6 - 17.2 g/dL    Hematocrit 45.1 41.1 - 50.3 %    MCV 96.0 79.6 - 97.8 FL    MCH 33.2 (H) 26.1 - 32.9 PG    MCHC 34.6 31.4 - 35.0 g/dL    RDW 13.6 11.9 - 14.6 %    Platelets 254 150 - 450 K/uL    MPV 9.7 9.4 - 12.3 FL    nRBC 0.00 0.0 - 0.2 K/uL   Comprehensive Metabolic Panel   Result Value Ref Range    Sodium 143 136 - 145 mmol/L    Potassium 3.4 (L) 3.5 - 5.1 mmol/L    Chloride 109 101 - 110 mmol/L    CO2 26 21 - 32 mmol/L    Anion Gap 8 4 - 13 mmol/L    Glucose 89 65 - 100 mg/dL    BUN 10 6 - 23 MG/DL    Creatinine 0.83 0.8 - 1.5 MG/DL     GFR African American >60 >60 ml/min/1.20m    GFR Non-African American >60 >60 ml/min/1.768m   Calcium 8.7 8.3 - 10.4 MG/DL    Total Bilirubin 0.1 (L) 0.2 - 1.1 MG/DL    ALT 31 12 - 65 U/L    AST 29 15 - 37 U/L    Alk Phosphatase 102 50 - 136 U/L    Total Protein 7.3 6.3 - 8.2 g/dL    Albumin 3.7 3.5 - 5.0 g/dL    Globulin 3.6 (H) 2.3 - 3.5 g/dL    Albumin/Globulin Ratio 1.0 (L) 1.2 - 3.5     Troponin   Result Value Ref Range    Troponin, High Sensitivity 9.2 0 - 14 pg/mL   Magnesium   Result Value Ref Range    Magnesium 2.1 1.8 - 2.4 mg/dL        XR CHEST (2 VW)   Final Result      1. Negative chest x-ray.                            Voice dictation software was used during the making of this note.  This software is not perfect and grammatical and other typographical errors may be present.  This note has not been completely proofread for errors.     FaWaylan RocherMD  03/05/21 [phone]

## 2021-03-05 NOTE — ED Notes (Signed)
I have reviewed discharge instructions with the patient.  The patient verbalized understanding.    Patient left ED via Discharge Method: ambulatory to Home with friend.    Opportunity for questions and clarification provided.       Patient given 0 scripts.         To continue your aftercare when you leave the hospital, you may receive an automated call from our care team to check in on how you are doing.  This is a free service and part of our promise to provide the best care and service to meet your aftercare needs.??? If you have questions, or wish to unsubscribe from this service please call [phone].  Thank you for Choosing our [facility] Emergency Department.        Tivis Ringer, RN  03/05/21 2348

## 2021-03-05 NOTE — Discharge Instructions (Addendum)
Follow-up with your cardiologist.  Avoid excessive alcohol use as this could trigger atrial fibrillation.  Return for worsening or concerning symptoms.

## 2021-03-06 ENCOUNTER — Inpatient Hospital Stay: Admit: 2021-03-06 | Primary: Sports Medicine

## 2021-03-06 ENCOUNTER — Inpatient Hospital Stay
Admit: 2021-03-06 | Discharge: 2021-03-06 | Disposition: A | Discharge status: Home / Self Care | Attending: Emergency Medicine

## 2021-03-06 LAB — EKG 12-LEAD
Atrial Rate: 89 {beats}/min
Diagnosis: NORMAL
P Axis: 24 degrees
P-R Interval: 188 ms
Q-T Interval: 396 ms
QRS Duration: 170 ms
QTc Calculation (Bazett): 481 ms
R Axis: -46 degrees
T Axis: 75 degrees
Ventricular Rate: 89 {beats}/min

## 2021-03-06 LAB — COMPREHENSIVE METABOLIC PANEL
ALT: 31 U/L (ref 12–65)
AST: 29 U/L (ref 15–37)
Albumin/Globulin Ratio: 1 — ABNORMAL LOW (ref 1.2–3.5)
Albumin: 3.7 g/dL (ref 3.5–5.0)
Alk Phosphatase: 102 U/L (ref 50–136)
Anion Gap: 8 mmol/L (ref 4–13)
BUN: 10 MG/DL (ref 6–23)
CO2: 26 mmol/L (ref 21–32)
Calcium: 8.7 MG/DL (ref 8.3–10.4)
Chloride: 109 mmol/L (ref 101–110)
Creatinine: 0.83 MG/DL (ref 0.8–1.5)
GFR African American: >60 mL/min/{1.73_m2} (ref >60)
GFR Non-African American: >60 mL/min/{1.73_m2} (ref >60)
Globulin: 3.6 g/dL — ABNORMAL HIGH (ref 2.3–3.5)
Glucose: 89 mg/dL (ref 65–100)
Potassium: 3.4 mmol/L — ABNORMAL LOW (ref 3.5–5.1)
Sodium: 143 mmol/L (ref 136–145)
Total Bilirubin: 0.1 MG/DL — ABNORMAL LOW (ref 0.2–1.1)
Total Protein: 7.3 g/dL (ref 6.3–8.2)

## 2021-03-06 LAB — CBC
Hematocrit: 45.1 % (ref 41.1–50.3)
Hemoglobin: 15.6 g/dL (ref 13.6–17.2)
MCH: 33.2 PG — ABNORMAL HIGH (ref 26.1–32.9)
MCHC: 34.6 g/dL (ref 31.4–35.0)
MCV: 96 FL (ref 79.6–97.8)
MPV: 9.7 FL (ref 9.4–12.3)
Platelets: 254 10*3/uL (ref 150–450)
RBC: 4.7 M/uL (ref 4.23–5.6)
RDW: 13.6 % (ref 11.9–14.6)
WBC: 9.8 10*3/uL (ref 4.3–11.1)
nRBC: 0 10*3/uL (ref 0.0–0.2)

## 2021-03-06 LAB — TROPONIN: Troponin, High Sensitivity: 9.2 pg/mL (ref 0–14)

## 2021-03-06 LAB — MAGNESIUM: Magnesium: 2.1 mg/dL (ref 1.8–2.4)

## 2021-03-06 MED ORDER — POTASSIUM BICARB-CITRIC ACID 10 MEQ PO TBEF
10 MEQ | ORAL | Status: AC
Start: 2021-03-06 — End: 2021-03-05
  Administered 2021-03-06: 04:00:00 40 meq via ORAL

## 2021-03-06 MED FILL — EFFER-K 10 MEQ PO TBEF: 10 MEQ | ORAL | Qty: 4

## 2021-09-14 IMAGING — CT CT HEAD W/O CM
4 of 5 series · 15 of 47 positions shown, 17 images · non-contrast
Comparison: Head CT November 23, 2013.

CLINICAL DATA: Pain following fall

EXAM:
CT HEAD WITHOUT CONTRAST
CT CERVICAL SPINE WITHOUT CONTRAST
TECHNIQUE: Multidetector CT imaging of the head and cervical spine was
performed following the standard protocol without intravenous
contrast. Multiplanar CT image reconstructions of the cervical spine
were also generated.

[Series 3: head wo · axial · 0.50mm/px · z∈[-72,+53]mm · 7 of 35 slices shown, 9 images]
[im 5/35  brain]
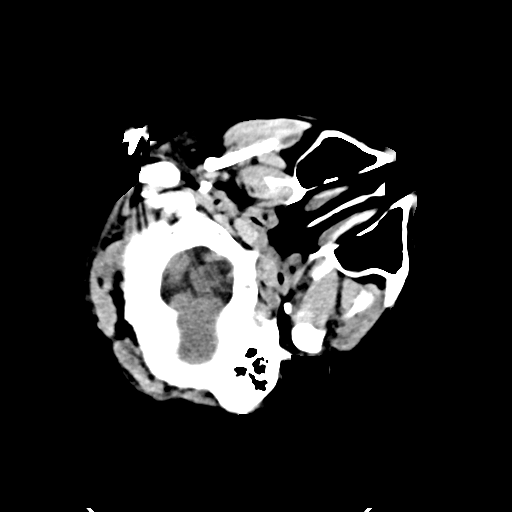
[im 5/35  bone]
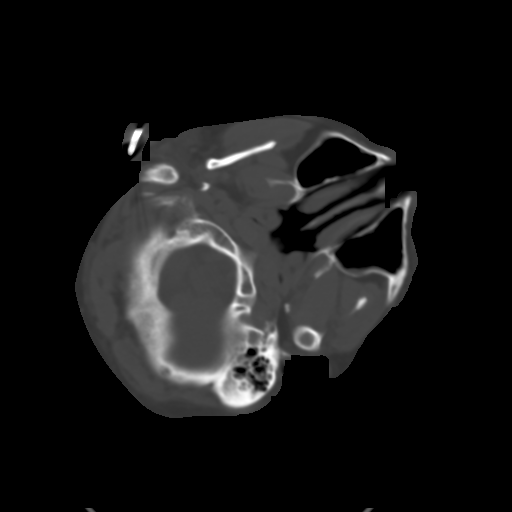
[im 9/35  brain]
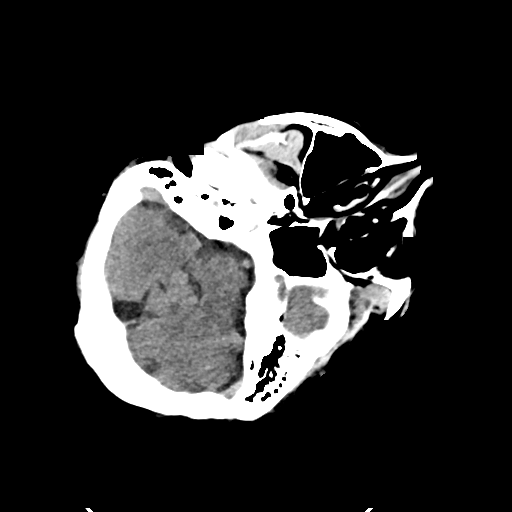
[im 13/35  brain]
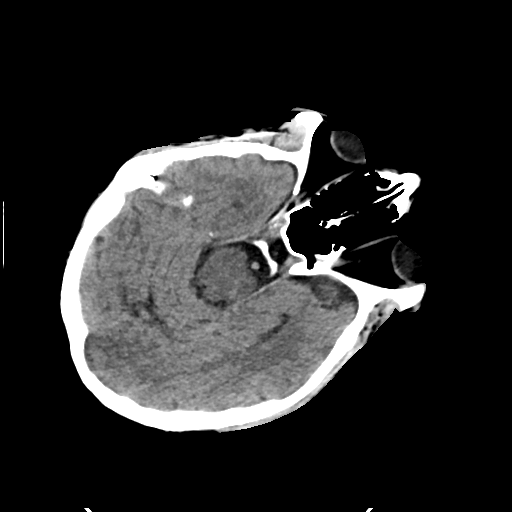
[im 18/35  brain]
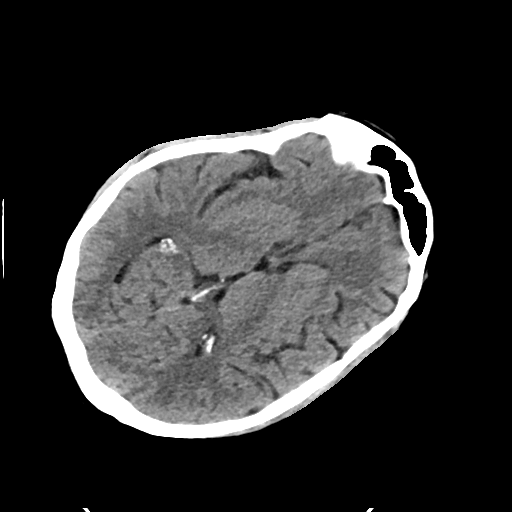
[im 22/35  brain]
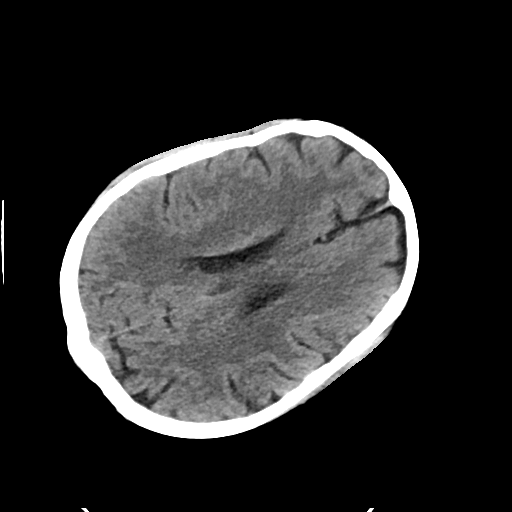
[im 22/35  bone]
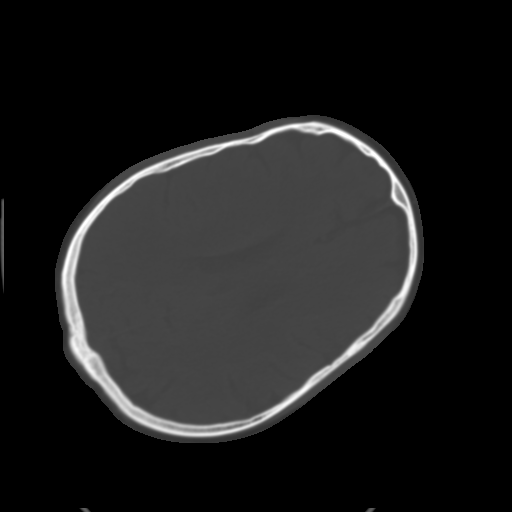
[im 26/35  brain]
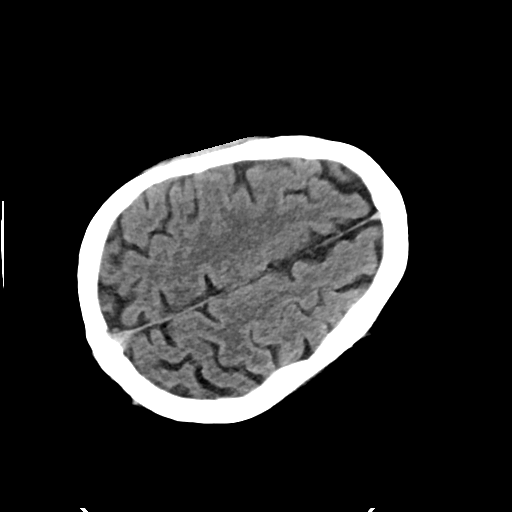
[im 30/35  brain]
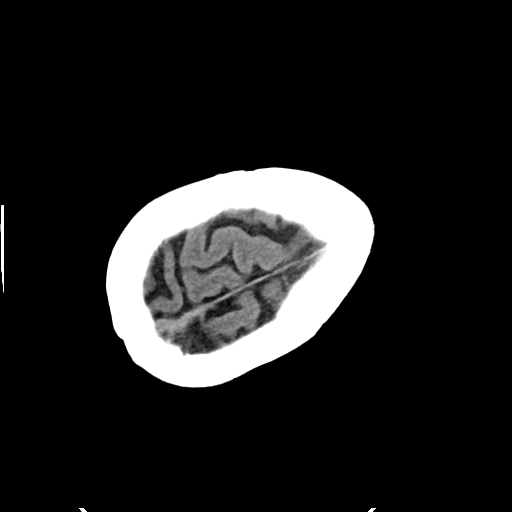

[Series 4: head bone · axial · 0.50mm/px · z∈[-76,-58]mm · 2 of 87 slices shown]
[im 9/87  bone]
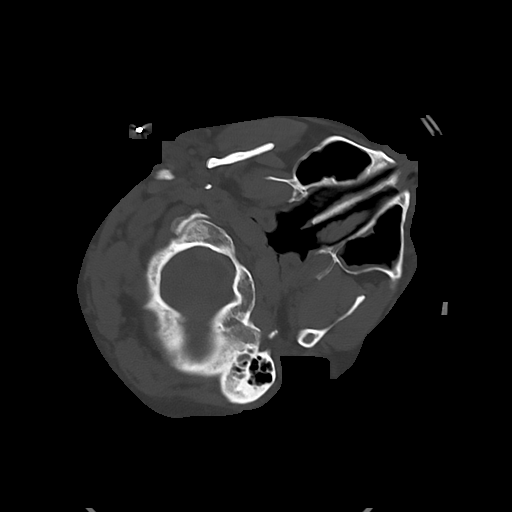
[im 18/87  bone]
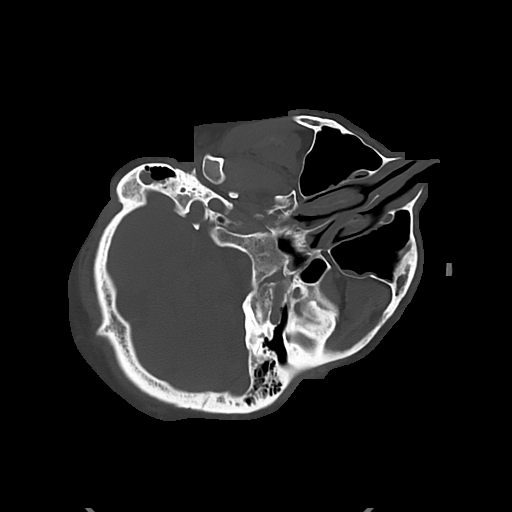

[Series 7: head wo cor · sagittal · 0.35mm/px · 3 of 46 slices shown (1 of 2)]
[im 17/46  brain]
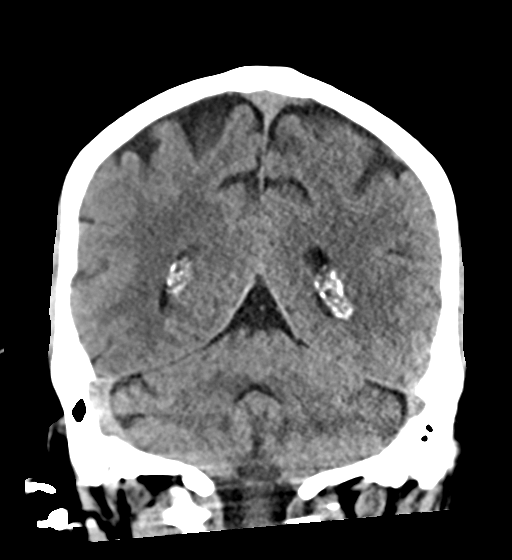
[im 23/46  brain]
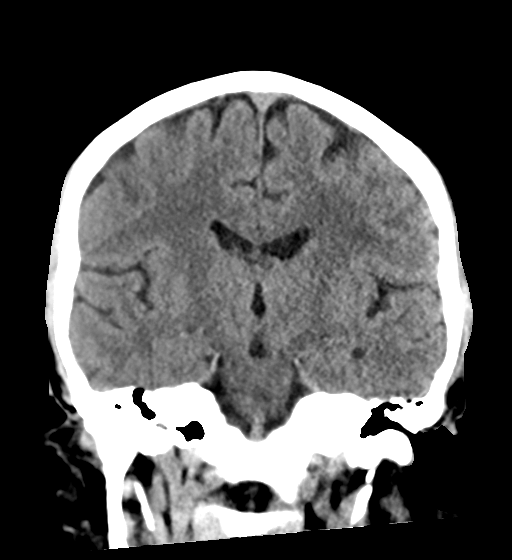
[im 30/46  brain]
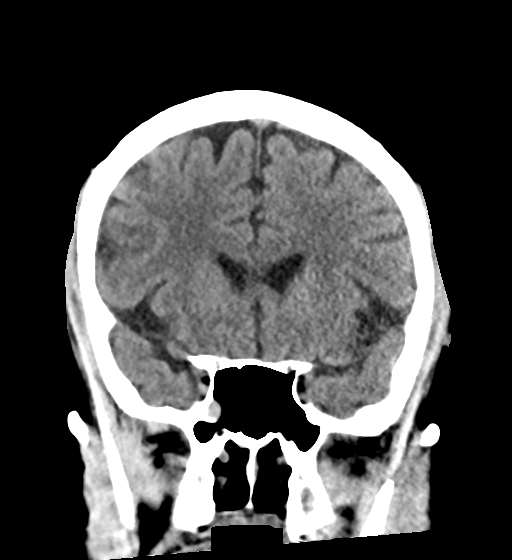

[Series 8: head wo cor · coronal · 0.32mm/px · 3 of 41 slices shown (2 of 2)]
[im 8/41  brain]
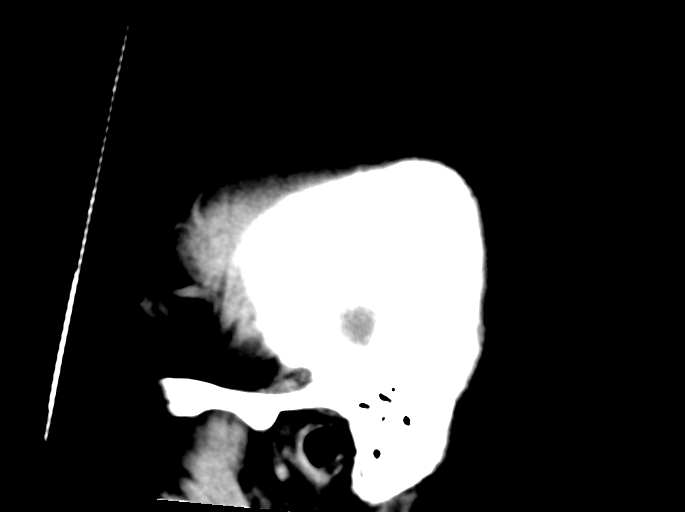
[im 19/41  brain]
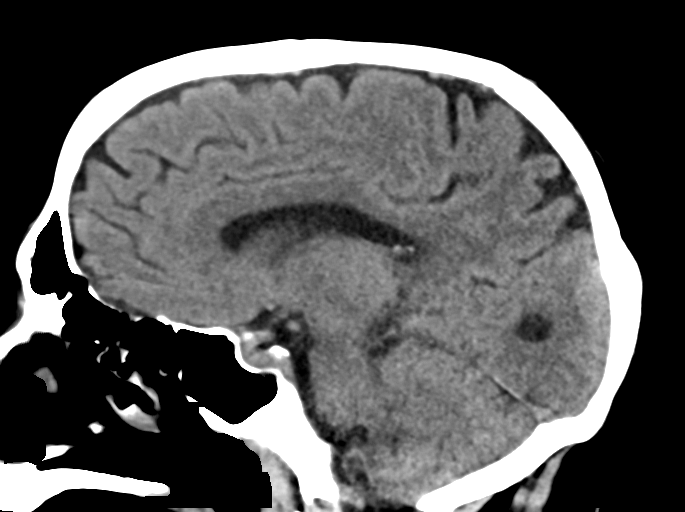
[im 30/41  brain]
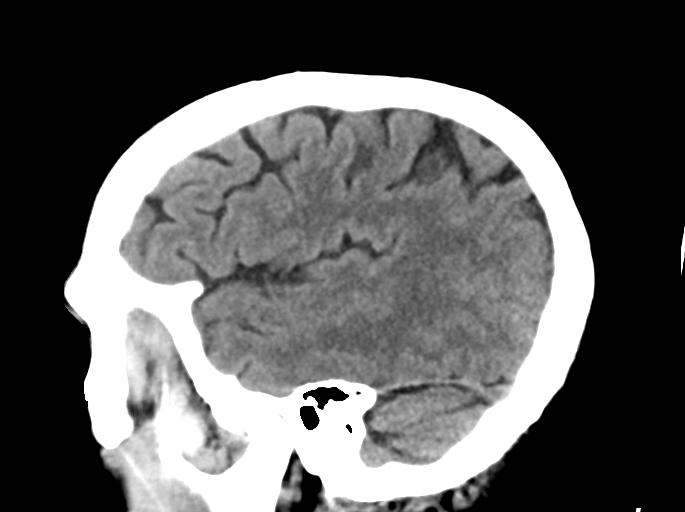

[15 of 47 positions shown; findings below may reference images not displayed]

FINDINGS: CT HEAD FINDINGS

Brain: The ventricles and sulci are normal in size and
configuration. There is no intracranial mass hemorrhage, extra-axial
fluid collection or midline shift. Brain parenchyma appears
unremarkable. No demonstrable acute infarct.

Vascular: No hyperdense vessel.  No evident vascular calcification.

Skull: The bony calvarium appears intact.

Sinuses/Orbits: There is mucosal thickening in the left maxillary
antrum. There is mucosal thickening and opacification in multiple
ethmoid air cells. There is a retention cyst in the lateral right
sphenoid sinus. There is a small retention cyst and a right frontal
sinus. Orbits appear symmetric bilaterally.

Other: There is opacification in several mastoid air cells
bilaterally.

CT CERVICAL SPINE FINDINGS

Alignment: Note that there is a degree of motion artifact. The
patient's head is canted toward the left during this study. Allowing
for this factor, there is no appreciable spondylolisthesis.

Skull base and vertebrae: No lesion reparable to the skull base
seen. Again noted is the head turning which results in the odontoid
closer to the lateral mass of C1 on the left than to the right.
There is no narrowing of the craniocervical junction. No fracture is
appreciable. No blastic or lytic bone lesions.

Soft tissues and spinal canal: The prevertebral soft tissues and
predental space regions appear within normal limits. No cord or
canal hematoma evident. No paraspinous lesions evident.

Disc levels: There is disc space narrowing, moderate, at C4-5 with
slightly milder disc space narrowing at C5-6 and C6-7. There are
anterior and posterior osteophytes at C4 and C5 with anterior
osteophytes at C6. There is facet hypertrophy at several levels.
There is exit foraminal narrowing due to bony hypertrophy at C3-4 on
the left and at C4-5 bilaterally. No frank disc extrusion or
stenosis evident.

Upper chest: Visualized upper lung regions are clear.

Other: Foci of calcification in right carotid artery.
IMPRESSION: CT head:

1.  Normal appearing brain parenchyma.  No mass or hemorrhage.

2.  Foci of paranasal sinus disease and mastoid air cell disease.

CT cervical spine: Study less than optimal due to head canted to the
left and a degree of motion artifact. No evident fracture or
spondylolisthesis. Osteoarthritic change at several levels, most
notable at C4-5. No disc extrusion or stenosis evident.

There is calcification in the right carotid artery.

## 2021-09-14 IMAGING — CT CT CERVICAL SPINE W/O CM
3 of 4 series · 13 of 34 positions shown, 14 images · non-contrast
Comparison: Head CT November 23, 2013.

CLINICAL DATA: Pain following fall

EXAM:
CT HEAD WITHOUT CONTRAST
CT CERVICAL SPINE WITHOUT CONTRAST
TECHNIQUE: Multidetector CT imaging of the head and cervical spine was
performed following the standard protocol without intravenous
contrast. Multiplanar CT image reconstructions of the cervical spine
were also generated.

[Series 5: c spine soft · axial · 0.39mm/px · z∈[-238,-94]mm · 4 of 105 slices shown, 5 images]
[im 17/105  soft-tissue]
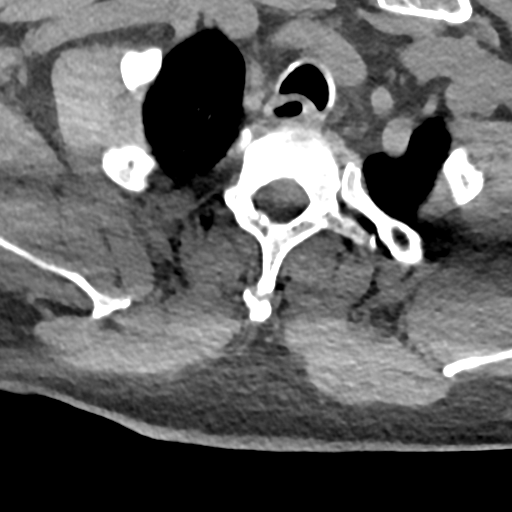
[im 17/105  bone]
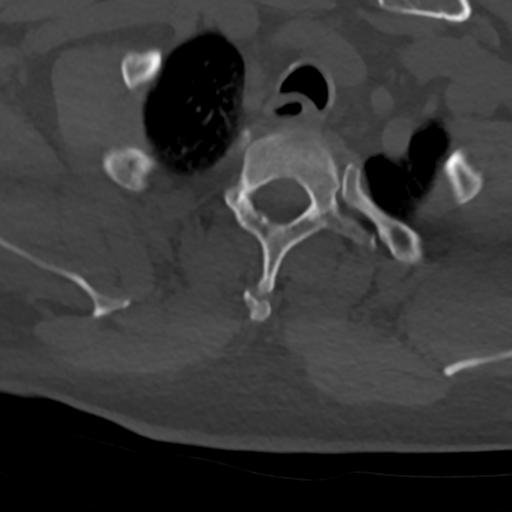
[im 41/105  bone]
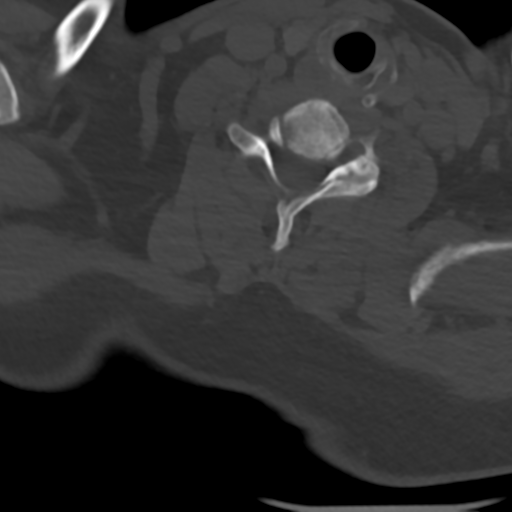
[im 65/105  bone]
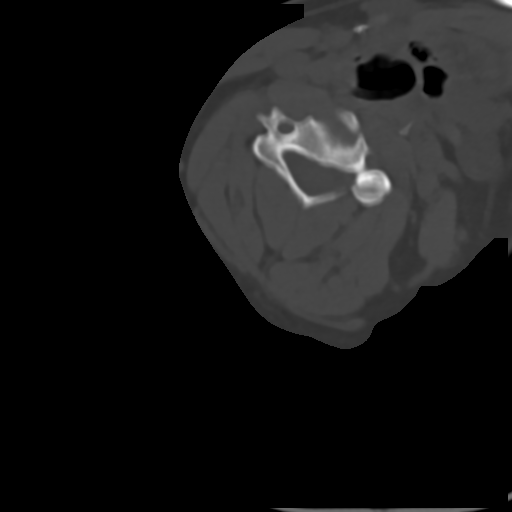
[im 89/105  bone]
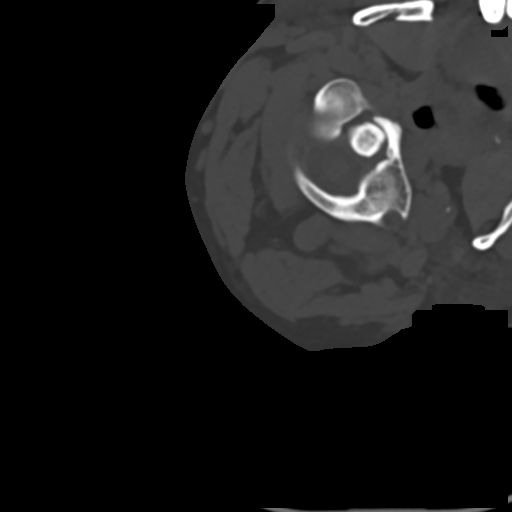

[Series 8: sagittal s.t. · sagittal · 0.33mm/px · 6 of 71 slices shown]
[im 12/71  bone]
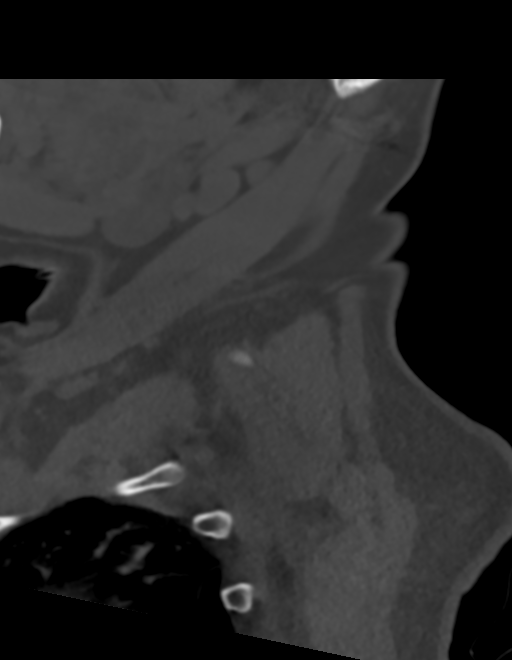
[im 24/71  bone]
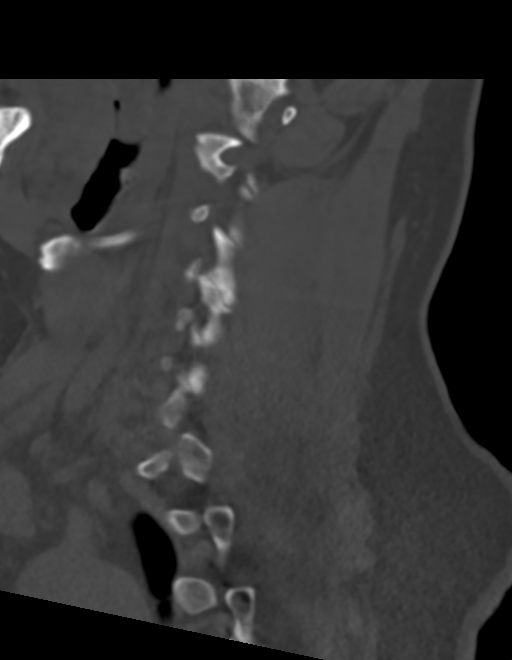
[im 36/71  bone]
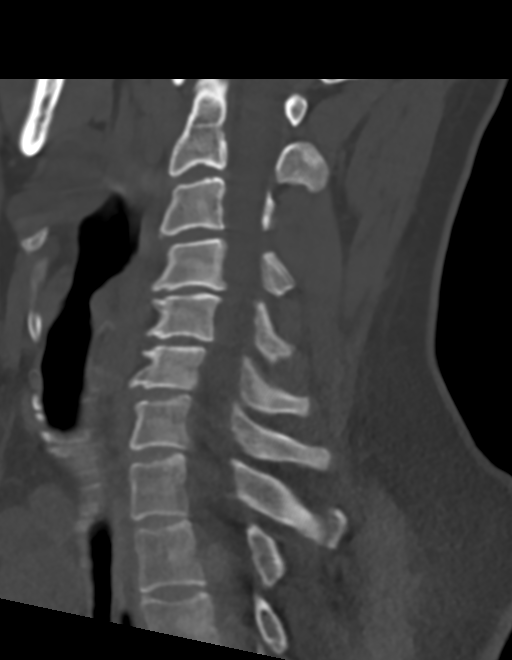
[im 47/71  bone]
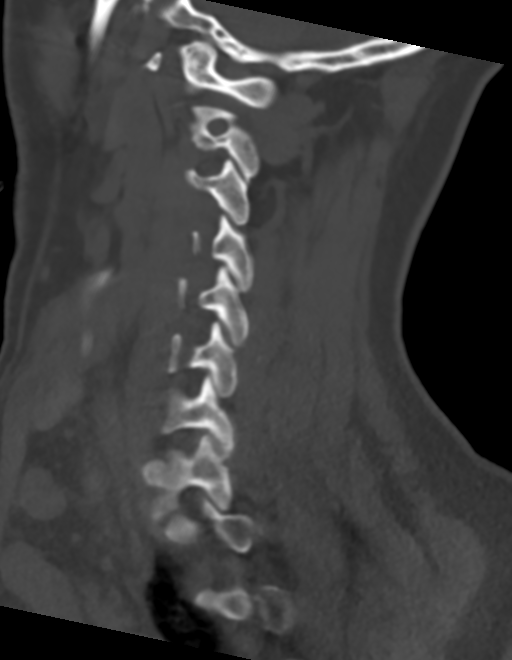
[im 48/71  soft-tissue]
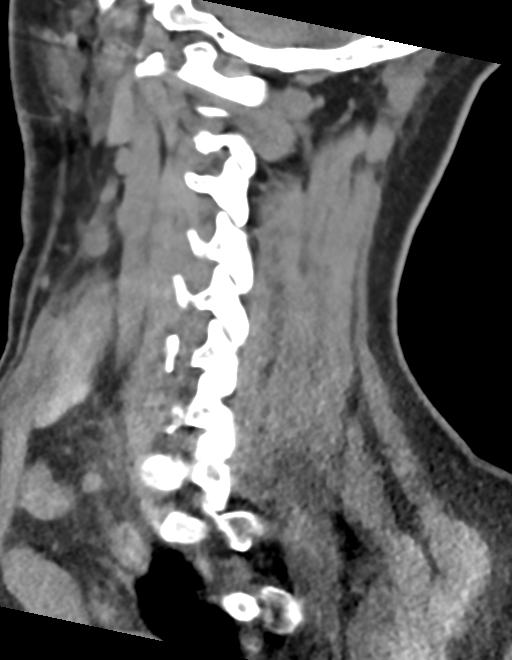
[im 59/71  bone]
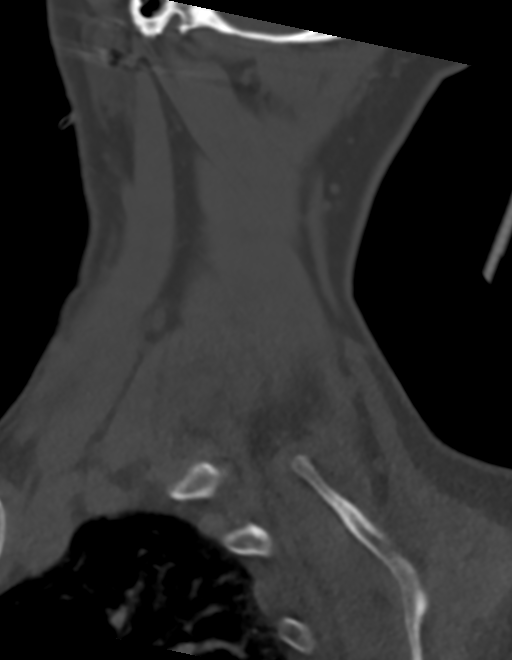

[Series 11: coronal bone · coronal · 0.30mm/px · 3 of 64 slices shown]
[im 13/64  bone]
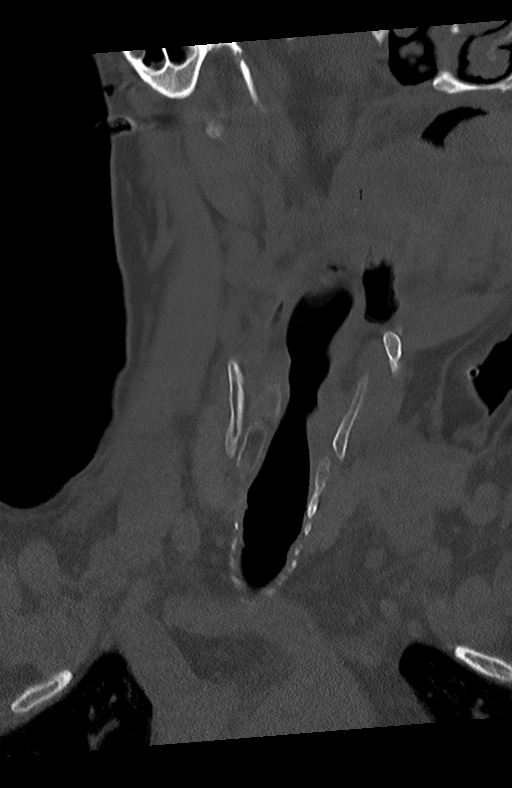
[im 26/64  bone]
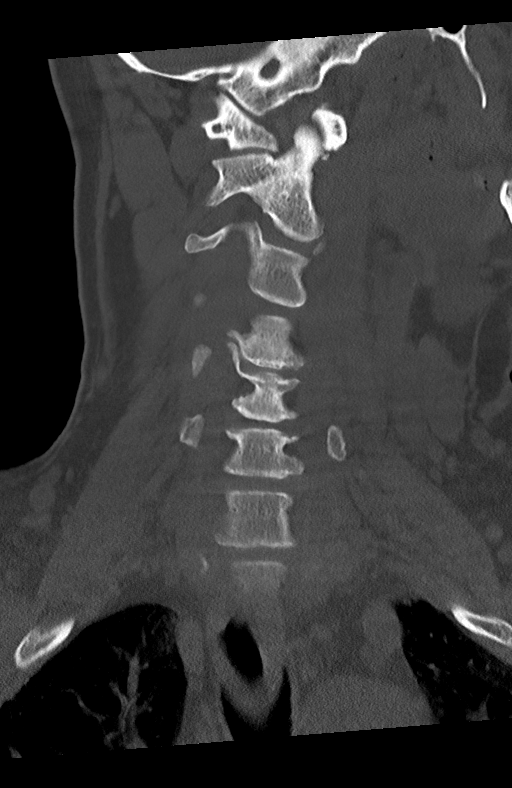
[im 38/64  bone]
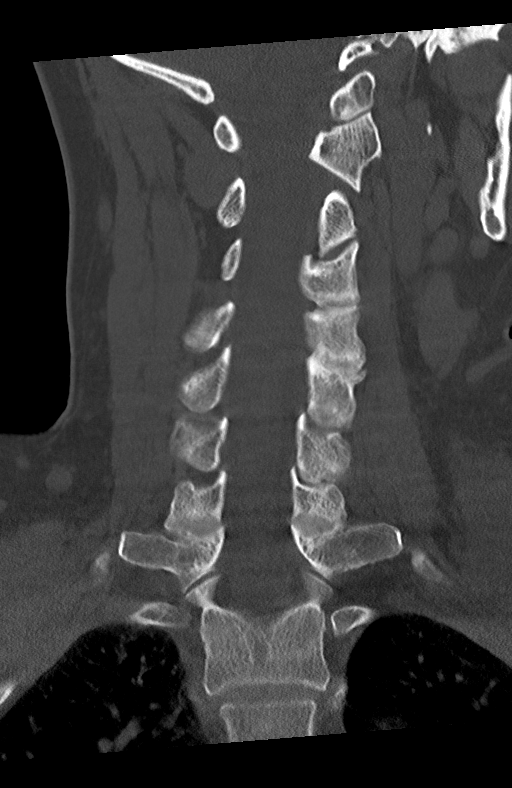

[13 of 34 positions shown; findings below may reference images not displayed]

FINDINGS: CT HEAD FINDINGS

Brain: The ventricles and sulci are normal in size and
configuration. There is no intracranial mass hemorrhage, extra-axial
fluid collection or midline shift. Brain parenchyma appears
unremarkable. No demonstrable acute infarct.

Vascular: No hyperdense vessel.  No evident vascular calcification.

Skull: The bony calvarium appears intact.

Sinuses/Orbits: There is mucosal thickening in the left maxillary
antrum. There is mucosal thickening and opacification in multiple
ethmoid air cells. There is a retention cyst in the lateral right
sphenoid sinus. There is a small retention cyst and a right frontal
sinus. Orbits appear symmetric bilaterally.

Other: There is opacification in several mastoid air cells
bilaterally.

CT CERVICAL SPINE FINDINGS

Alignment: Note that there is a degree of motion artifact. The
patient's head is canted toward the left during this study. Allowing
for this factor, there is no appreciable spondylolisthesis.

Skull base and vertebrae: No lesion reparable to the skull base
seen. Again noted is the head turning which results in the odontoid
closer to the lateral mass of C1 on the left than to the right.
There is no narrowing of the craniocervical junction. No fracture is
appreciable. No blastic or lytic bone lesions.

Soft tissues and spinal canal: The prevertebral soft tissues and
predental space regions appear within normal limits. No cord or
canal hematoma evident. No paraspinous lesions evident.

Disc levels: There is disc space narrowing, moderate, at C4-5 with
slightly milder disc space narrowing at C5-6 and C6-7. There are
anterior and posterior osteophytes at C4 and C5 with anterior
osteophytes at C6. There is facet hypertrophy at several levels.
There is exit foraminal narrowing due to bony hypertrophy at C3-4 on
the left and at C4-5 bilaterally. No frank disc extrusion or
stenosis evident.

Upper chest: Visualized upper lung regions are clear.

Other: Foci of calcification in right carotid artery.
IMPRESSION: CT head:

1.  Normal appearing brain parenchyma.  No mass or hemorrhage.

2.  Foci of paranasal sinus disease and mastoid air cell disease.

CT cervical spine: Study less than optimal due to head canted to the
left and a degree of motion artifact. No evident fracture or
spondylolisthesis. Osteoarthritic change at several levels, most
notable at C4-5. No disc extrusion or stenosis evident.

There is calcification in the right carotid artery.
